# Patient Record
Sex: Female | Born: 1970 | Race: White | Hispanic: No | Marital: Married | Smoking: Never smoker
Health system: Southern US, Community
[De-identification: ages and names within clinical notes are randomized; demographics above are authoritative.]

## PROBLEM LIST (undated history)

## (undated) DIAGNOSIS — J302 Other seasonal allergic rhinitis: Secondary | ICD-10-CM

## (undated) HISTORY — PX: CHOLECYSTECTOMY: SHX55

---

## 2004-06-11 ENCOUNTER — Other Ambulatory Visit: Admission: RE | Admit: 2004-06-11 | Discharge: 2004-06-11 | Payer: Self-pay | Admitting: Obstetrics and Gynecology

## 2005-07-22 ENCOUNTER — Other Ambulatory Visit: Admission: RE | Admit: 2005-07-22 | Discharge: 2005-07-22 | Payer: Self-pay | Admitting: Obstetrics and Gynecology

## 2005-12-06 ENCOUNTER — Emergency Department (HOSPITAL_COMMUNITY): Admission: EM | Admit: 2005-12-06 | Discharge: 2005-12-06 | Payer: Self-pay | Admitting: Family Medicine

## 2006-02-25 ENCOUNTER — Encounter: Admission: RE | Admit: 2006-02-25 | Discharge: 2006-02-25 | Payer: Self-pay | Admitting: Obstetrics and Gynecology

## 2006-08-22 ENCOUNTER — Encounter: Admission: RE | Admit: 2006-08-22 | Discharge: 2006-08-22 | Payer: Self-pay | Admitting: Obstetrics and Gynecology

## 2007-02-26 ENCOUNTER — Encounter: Admission: RE | Admit: 2007-02-26 | Discharge: 2007-02-26 | Payer: Self-pay | Admitting: Obstetrics and Gynecology

## 2008-11-04 ENCOUNTER — Emergency Department (HOSPITAL_COMMUNITY): Admission: EM | Admit: 2008-11-04 | Discharge: 2008-11-04 | Payer: Self-pay | Admitting: Family Medicine

## 2010-02-15 ENCOUNTER — Ambulatory Visit: Payer: Self-pay | Admitting: Vascular Surgery

## 2010-05-26 ENCOUNTER — Encounter: Payer: Self-pay | Admitting: Obstetrics and Gynecology

## 2010-07-16 ENCOUNTER — Other Ambulatory Visit: Payer: Self-pay | Admitting: Obstetrics and Gynecology

## 2010-07-16 DIAGNOSIS — Z1231 Encounter for screening mammogram for malignant neoplasm of breast: Secondary | ICD-10-CM

## 2010-07-20 ENCOUNTER — Ambulatory Visit
Admission: RE | Admit: 2010-07-20 | Discharge: 2010-07-20 | Disposition: A | Discharge status: Home / Self Care | Payer: 59 | Source: Ambulatory Visit | Attending: Obstetrics and Gynecology | Admitting: Obstetrics and Gynecology

## 2010-07-20 DIAGNOSIS — Z1231 Encounter for screening mammogram for malignant neoplasm of breast: Secondary | ICD-10-CM

## 2011-06-23 ENCOUNTER — Encounter (HOSPITAL_COMMUNITY): Payer: Self-pay | Admitting: *Deleted

## 2011-06-23 ENCOUNTER — Emergency Department (HOSPITAL_COMMUNITY)
Admission: EM | Admit: 2011-06-23 | Discharge: 2011-06-23 | Disposition: A | Discharge status: Home / Self Care | Payer: 59 | Source: Home / Self Care

## 2011-06-23 DIAGNOSIS — N390 Urinary tract infection, site not specified: Secondary | ICD-10-CM

## 2011-06-23 HISTORY — DX: Other seasonal allergic rhinitis: J30.2

## 2011-06-23 LAB — POCT URINALYSIS DIP (DEVICE)
Glucose, UA: NEGATIVE mg/dL
Nitrite: NEGATIVE
Protein, ur: NEGATIVE mg/dL
Specific Gravity, Urine: 1.015 (ref 1.005–1.030)
Urobilinogen, UA: 0.2 mg/dL (ref 0.0–1.0)

## 2011-06-23 MED ORDER — CEPHALEXIN 500 MG PO CAPS: 500.0000 mg | ORAL_CAPSULE | Freq: Four times a day (QID) | ORAL | Status: AC

## 2011-06-23 NOTE — ED Provider Notes (Signed)
History     CSN: [id number]  Arrival date & time 06/23/11  1504   None     Chief Complaint  Patient presents with  . Urinary Frequency  . Dysuria    (Consider location/radiation/quality/duration/timing/severity/associated sxs/prior treatment) Patient is a 41 y.o. female presenting with dysuria. The history is provided by the patient.  Dysuria  This is a new problem. The current episode started 6 to 12 hours ago. The problem occurs every urination. The problem has been gradually worsening. The quality of the pain is described as burning. The pain is mild. There has been no fever. She is sexually active. There is no history of pyelonephritis. Associated symptoms include nausea, frequency, hematuria and urgency. Pertinent negatives include no chills, no vomiting, no discharge, no possible pregnancy and no flank pain. She has tried nothing for the symptoms.    Past Medical History  Diagnosis Date  . Seasonal allergies     Past Surgical History  Procedure Date  . Cholecystectomy     History reviewed. No pertinent family history.  History  Substance Use Topics  . Smoking status: Never Smoker   . Smokeless tobacco: Not on file  . Alcohol Use: No    OB History    Grav Para Term Preterm Abortions TAB SAB Ect Mult Living                  Review of Systems  Constitutional: Negative for fever and chills.  Gastrointestinal: Positive for nausea. Negative for vomiting and abdominal pain.  Genitourinary: Positive for dysuria, urgency, frequency and hematuria. Negative for flank pain.    Allergies  Erythromycin base  Home Medications   Current Outpatient Rx  Name Route Sig Dispense Refill  . FEXOFENADINE HCL 180 MG PO TABS Oral Take 180 mg by mouth daily.    Marland Kitchen FLUTICASONE PROPIONATE 50 MCG/ACT NA SUSP Nasal Place 2 sprays into the nose daily.    Marland Kitchen NORGESTIMATE-ETH ESTRADIOL 0.25-35 MG-MCG PO TABS Oral Take 1 tablet by mouth daily.    Marland Kitchen OVER THE COUNTER MEDICATION   Vitamins    . PHENYLEPHRINE HCL 10 MG PO TABS Oral Take 10 mg by mouth every 4 (four) hours as needed.    . CEPHALEXIN 500 MG PO CAPS Oral Take 1 capsule (500 mg total) by mouth 4 (four) times daily. 20 capsule 0    BP 120/77  Pulse 84  Temp 98.1 F (36.7 C)  Resp 16  SpO2 100%  LMP 05/31/2011  Physical Exam  Constitutional: She appears well-developed and well-nourished. No distress.  Cardiovascular: Normal rate and regular rhythm.   Pulmonary/Chest: Effort normal and breath sounds normal.  Abdominal: Soft. Bowel sounds are normal. There is no tenderness. There is no CVA tenderness.    ED Course  Procedures (including critical care time)  Labs Reviewed  POCT URINALYSIS DIP (DEVICE) - Abnormal; Notable for the following:    Hgb urine dipstick LARGE (*)    pH 8.5 (*)    Leukocytes, UA MODERATE (*) Biochemical Testing Only. Please order routine urinalysis from main lab if confirmatory testing is needed.   All other components within normal limits  POCT PREGNANCY, URINE   No results found.   1. UTI (lower urinary tract infection)       MDM          Cathlyn Parsons, NP 06/23/11 [phone]

## 2011-06-23 NOTE — Discharge Instructions (Signed)

## 2011-06-23 NOTE — ED Provider Notes (Signed)
Medical screening examination/treatment/procedure(s) were performed by non-physician practitioner and as supervising physician I was immediately available for consultation/collaboration.  Raynald Blend, MD 06/23/11 1800

## 2011-06-23 NOTE — ED Notes (Signed)
Pt with c/o burning with urination and frequency onset today - denies vaginal discharge or irritation

## 2011-07-03 ENCOUNTER — Other Ambulatory Visit: Payer: Self-pay | Admitting: Obstetrics and Gynecology

## 2011-07-03 DIAGNOSIS — Z1231 Encounter for screening mammogram for malignant neoplasm of breast: Secondary | ICD-10-CM

## 2011-08-22 ENCOUNTER — Ambulatory Visit
Admission: RE | Admit: 2011-08-22 | Discharge: 2011-08-22 | Disposition: A | Discharge status: Home / Self Care | Payer: 59 | Source: Ambulatory Visit | Attending: Obstetrics and Gynecology | Admitting: Obstetrics and Gynecology

## 2011-08-22 DIAGNOSIS — Z1231 Encounter for screening mammogram for malignant neoplasm of breast: Secondary | ICD-10-CM

## 2012-08-03 ENCOUNTER — Other Ambulatory Visit: Payer: Self-pay

## 2012-08-03 DIAGNOSIS — Z1231 Encounter for screening mammogram for malignant neoplasm of breast: Secondary | ICD-10-CM

## 2012-09-02 ENCOUNTER — Ambulatory Visit
Admission: RE | Admit: 2012-09-02 | Discharge: 2012-09-02 | Disposition: A | Discharge status: Home / Self Care | Payer: 59 | Source: Ambulatory Visit

## 2012-09-02 DIAGNOSIS — Z1231 Encounter for screening mammogram for malignant neoplasm of breast: Secondary | ICD-10-CM

## 2013-04-19 ENCOUNTER — Encounter (HOSPITAL_COMMUNITY): Payer: Self-pay | Admitting: Emergency Medicine

## 2013-04-19 ENCOUNTER — Emergency Department (HOSPITAL_COMMUNITY)
Admission: EM | Admit: 2013-04-19 | Discharge: 2013-04-19 | Disposition: A | Discharge status: Home / Self Care | Payer: 59 | Source: Home / Self Care | Attending: Family Medicine | Admitting: Family Medicine

## 2013-04-19 DIAGNOSIS — J329 Chronic sinusitis, unspecified: Secondary | ICD-10-CM

## 2013-04-19 MED ORDER — AMOXICILLIN 500 MG PO CAPS: 1000.0000 mg | ORAL_CAPSULE | Freq: Two times a day (BID) | ORAL | Status: DC

## 2013-04-19 MED ORDER — IPRATROPIUM BROMIDE 0.06 % NA SOLN: 2.0000 | Freq: Four times a day (QID) | NASAL | Status: DC

## 2013-04-19 NOTE — ED Provider Notes (Signed)
Kathy Boyd is a 42 y.o. female who presents to Urgent Care today for 3 days of right-sided sinus pain pressure. This is also associated with some sinus drainage. She has tried Advil Sudafed and Mucinex which are somewhat helpful. Patient has a history of frequent sinus infections. Her current symptoms are consistent with sinus infection. No nausea vomiting diarrhea fevers or chills.   Past Medical History  Diagnosis Date  . Seasonal allergies    History  Substance Use Topics  . Smoking status: Never Smoker   . Smokeless tobacco: Not on file  . Alcohol Use: No   ROS as above Medications reviewed. No current facility-administered medications for this encounter.   Current Outpatient Prescriptions  Medication Sig Dispense Refill  . amoxicillin (AMOXIL) 500 MG capsule Take 2 capsules (1,000 mg total) by mouth 2 (two) times daily.  28 capsule  0  . fexofenadine (ALLEGRA) 180 MG tablet Take 180 mg by mouth daily.      . fluticasone (FLONASE) 50 MCG/ACT nasal spray Place 2 sprays into the nose daily.      Marland Kitchen ipratropium (ATROVENT) 0.06 % nasal spray Place 2 sprays into both nostrils 4 (four) times daily.  15 mL  1  . norgestimate-ethinyl estradiol (ORTHO-CYCLEN,SPRINTEC,PREVIFEM) 0.25-35 MG-MCG tablet Take 1 tablet by mouth daily.      Marland Kitchen OVER THE COUNTER MEDICATION Vitamins      . phenylephrine (SUDAFED PE) 10 MG TABS Take 10 mg by mouth every 4 (four) hours as needed.        Exam:  BP 122/80  Pulse 76  Temp(Src) 98 F (36.7 C) (Oral)  Resp 16  SpO2 98% Gen: Well NAD HEENT: EOMI,  MMM, tender palpation right maxillary sinus. No discharge. Posterior pharynx mildly erythematous. Tympanic membranes are normal appearing bilaterally Lungs: Normal work of breathing. CTABL Heart: RRR no MRG Exts:  warm and well perfused.   No results found for this or any previous visit (from the past 24 hour(s)). No results found.  Assessment and Plan: 42 y.o. female with sinusitis. Plan for  management with Atrovent nasal spray and amoxicillin. F/u with ENT Discussed warning signs or symptoms. Please see discharge instructions. Patient expresses understanding.    Rodolph Bong, MD 04/19/13 [phone]

## 2013-04-19 NOTE — ED Notes (Signed)
Reported recurrent sinus infection

## 2013-08-05 ENCOUNTER — Other Ambulatory Visit: Payer: Self-pay

## 2013-08-05 DIAGNOSIS — Z1231 Encounter for screening mammogram for malignant neoplasm of breast: Secondary | ICD-10-CM

## 2013-09-07 ENCOUNTER — Ambulatory Visit
Admission: RE | Admit: 2013-09-07 | Discharge: 2013-09-07 | Disposition: A | Discharge status: Home / Self Care | Payer: 59 | Source: Ambulatory Visit

## 2013-09-07 ENCOUNTER — Encounter (INDEPENDENT_AMBULATORY_CARE_PROVIDER_SITE_OTHER): Payer: Self-pay

## 2013-09-07 DIAGNOSIS — Z1231 Encounter for screening mammogram for malignant neoplasm of breast: Secondary | ICD-10-CM

## 2014-05-02 ENCOUNTER — Telehealth: Payer: Self-pay | Admitting: Family

## 2014-05-02 DIAGNOSIS — J019 Acute sinusitis, unspecified: Secondary | ICD-10-CM

## 2014-05-02 MED ORDER — AMOXICILLIN-POT CLAVULANATE 875-125 MG PO TABS: 1.0000 | ORAL_TABLET | Freq: Two times a day (BID) | ORAL | Status: DC

## 2014-05-02 NOTE — Progress Notes (Signed)

## 2014-08-01 ENCOUNTER — Other Ambulatory Visit: Payer: Self-pay

## 2014-08-01 DIAGNOSIS — Z1231 Encounter for screening mammogram for malignant neoplasm of breast: Secondary | ICD-10-CM

## 2014-09-09 ENCOUNTER — Ambulatory Visit
Admission: RE | Admit: 2014-09-09 | Discharge: 2014-09-09 | Disposition: A | Discharge status: Home / Self Care | Payer: 59 | Source: Ambulatory Visit

## 2014-09-09 DIAGNOSIS — Z1231 Encounter for screening mammogram for malignant neoplasm of breast: Secondary | ICD-10-CM

## 2014-09-12 ENCOUNTER — Telehealth: Payer: Self-pay | Admitting: Family

## 2014-09-12 DIAGNOSIS — J019 Acute sinusitis, unspecified: Secondary | ICD-10-CM

## 2014-09-12 MED ORDER — AMOXICILLIN-POT CLAVULANATE 875-125 MG PO TABS: 1.0000 | ORAL_TABLET | Freq: Two times a day (BID) | ORAL | Status: DC

## 2014-09-12 NOTE — Progress Notes (Signed)

## 2015-08-08 ENCOUNTER — Telehealth: Payer: 59 | Admitting: Family

## 2015-08-08 DIAGNOSIS — J019 Acute sinusitis, unspecified: Secondary | ICD-10-CM

## 2015-08-08 DIAGNOSIS — B9689 Other specified bacterial agents as the cause of diseases classified elsewhere: Secondary | ICD-10-CM

## 2015-08-08 MED ORDER — AMOXICILLIN-POT CLAVULANATE 875-125 MG PO TABS: 1.0000 | ORAL_TABLET | Freq: Two times a day (BID) | ORAL | Status: DC

## 2015-08-08 NOTE — Progress Notes (Signed)

## 2015-08-11 ENCOUNTER — Other Ambulatory Visit: Payer: Self-pay

## 2015-08-11 DIAGNOSIS — Z1231 Encounter for screening mammogram for malignant neoplasm of breast: Secondary | ICD-10-CM

## 2015-09-11 ENCOUNTER — Ambulatory Visit
Admission: RE | Admit: 2015-09-11 | Discharge: 2015-09-11 | Disposition: A | Discharge status: Home / Self Care | Payer: 59 | Source: Ambulatory Visit

## 2015-09-11 DIAGNOSIS — Z1231 Encounter for screening mammogram for malignant neoplasm of breast: Secondary | ICD-10-CM | POA: Diagnosis not present

## 2015-10-20 ENCOUNTER — Telehealth: Payer: 59 | Admitting: Nurse Practitioner

## 2015-10-20 DIAGNOSIS — J0101 Acute recurrent maxillary sinusitis: Secondary | ICD-10-CM

## 2015-10-20 MED ORDER — AMOXICILLIN 500 MG PO CAPS: 1000.0000 mg | ORAL_CAPSULE | Freq: Two times a day (BID) | ORAL | Status: DC

## 2015-10-20 NOTE — Progress Notes (Signed)
We are sorry that you are not feeling well.  Here is how we plan to help!  Based on what you have shared with me it looks like you have sinusitis.  Sinusitis is inflammation and infection in the sinus cavities of the head.  Based on your presentation I believe you most likely have Acute Bacterial Sinusitis.  This is an infection caused by bacteria and is treated with antibiotics. I have prescribed amoxicillin 500 mg 2po bid. You may use an oral decongestant such as Mucinex D or if you have glaucoma or high blood pressure use plain Mucinex. Saline nasal spray help and can safely be used as often as needed for congestion.  If you develop worsening sinus pain, fever or notice severe headache and vision changes, or if symptoms are not better after completion of antibiotic, please schedule an appointment with a health care provider.    Sinus infections are not as easily transmitted as other respiratory infection, however we still recommend that you avoid close contact with loved ones, especially the very young and elderly.  Remember to wash your hands thoroughly throughout the day as this is the number one way to prevent the spread of infection!  Home Care:  Only take medications as instructed by your medical team.  Complete the entire course of an antibiotic.  Do not take these medications with alcohol.  A steam or ultrasonic humidifier can help congestion.  You can place a towel over your head and breathe in the steam from hot water coming from a faucet.  Avoid close contacts especially the very young and the elderly.  Cover your mouth when you cough or sneeze.  Always remember to wash your hands.  Get Help Right Away If:  You develop worsening fever or sinus pain.  You develop a severe head ache or visual changes.  Your symptoms persist after you have completed your treatment plan.  Make sure you  Understand these instructions.  Will watch your condition.  Will get help right away  if you are not doing well or get worse.  Your e-visit answers were reviewed by a board certified advanced clinical practitioner to complete your personal care plan.  Depending on the condition, your plan could have included both over the counter or prescription medications.  If there is a problem please reply  once you have received a response from your provider.  Your safety is important to us.  If you have drug allergies check your prescription carefully.    You can use MyChart to ask questions about today's visit, request a non-urgent call back, or ask for a work or school excuse for 24 hours related to this e-Visit. If it has been greater than 24 hours you will need to follow up with your provider, or enter a new e-Visit to address those concerns.  You will get an e-mail in the next two days asking about your experience.  I hope that your e-visit has been valuable and will speed your recovery. Thank you for using e-visits.

## 2016-02-07 DIAGNOSIS — Z01419 Encounter for gynecological examination (general) (routine) without abnormal findings: Secondary | ICD-10-CM | POA: Diagnosis not present

## 2016-02-07 DIAGNOSIS — Z6838 Body mass index (BMI) 38.0-38.9, adult: Secondary | ICD-10-CM | POA: Diagnosis not present

## 2016-02-11 ENCOUNTER — Telehealth: Payer: 59 | Admitting: Family

## 2016-02-11 DIAGNOSIS — J329 Chronic sinusitis, unspecified: Secondary | ICD-10-CM | POA: Diagnosis not present

## 2016-02-11 DIAGNOSIS — B9689 Other specified bacterial agents as the cause of diseases classified elsewhere: Secondary | ICD-10-CM | POA: Diagnosis not present

## 2016-02-11 MED ORDER — AMOXICILLIN-POT CLAVULANATE 875-125 MG PO TABS: 1.0000 | ORAL_TABLET | Freq: Two times a day (BID) | ORAL | 0 refills | Status: AC

## 2016-02-11 NOTE — Progress Notes (Signed)

## 2016-02-12 MED FILL — AMOX-CLAV 875-125 MG TABLET: 875-125 | 7 days supply | Qty: 14 | Fill #0

## 2016-03-12 ENCOUNTER — Telehealth: Payer: 59 | Admitting: Family

## 2016-03-12 DIAGNOSIS — J320 Chronic maxillary sinusitis: Secondary | ICD-10-CM

## 2016-03-12 NOTE — Progress Notes (Signed)
Based on what you shared with me it looks like you have a serious condition that should be evaluated in a face to face office visit.  I am concerned that you have had 3 similar episodes this year. This warrants further evaluation.   If you are having a true medical emergency please call 911.  If you need an urgent face to face visit, [place] has four urgent care centers for your convenience.  If you need care fast and have a high deductible or no insurance consider:   [email or web address]  [phone]  [address], [address] [place], [place] [address] 8 am to 8 pm Monday-Friday 10 am to 4 pm Saturday-Sunday   The following sites will take your  insurance:    . [facility]  [phone] Get Driving Directions Find a Provider at this Location  [address] [place], [place] [address] . 10 am to 8 pm Monday-Friday . 12 pm to 8 pm Saturday-Sunday   . [facility]  [phone] Get Driving Directions Find a Provider at this Location  [address] [place], [place] [address] . 8 am to 8 pm Monday-Friday . 9 am to 6 pm Saturday . 11 am to 6 pm Sunday   . [facility] [place]  [phone] Get Driving Directions  [address] [place], [place] [address] . 8 am to 8 pm Monday-Friday . 8 am to 4 pm Saturday-Sunday   . [facility] (walk-ins welcome, or call for a scheduled time)  [phone]  Get Driving Directions Find a Provider at this Location  [address] [place], [place] [address] . 8 am to 8:30 pm Monday-Thursday . 8 am to 6 pm Friday . 8 am to 4 pm Saturday-Sunday   Your e-visit answers were reviewed by a board certified advanced clinical practitioner to complete your personal care plan.  Thank you for using e-Visits.

## 2016-04-30 ENCOUNTER — Telehealth: Payer: 59 | Admitting: Family

## 2016-04-30 DIAGNOSIS — A499 Bacterial infection, unspecified: Secondary | ICD-10-CM | POA: Diagnosis not present

## 2016-04-30 DIAGNOSIS — N390 Urinary tract infection, site not specified: Secondary | ICD-10-CM | POA: Diagnosis not present

## 2016-04-30 MED ORDER — NITROFURANTOIN MONOHYD MACRO 100 MG PO CAPS: 100.0000 mg | ORAL_CAPSULE | Freq: Two times a day (BID) | ORAL | 0 refills | Status: DC

## 2016-04-30 NOTE — Progress Notes (Signed)

## 2016-05-24 ENCOUNTER — Telehealth: Payer: 59 | Admitting: Nurse Practitioner

## 2016-05-24 DIAGNOSIS — J0101 Acute recurrent maxillary sinusitis: Secondary | ICD-10-CM

## 2016-05-24 MED ORDER — AMOXICILLIN-POT CLAVULANATE 875-125 MG PO TABS: 1.0000 | ORAL_TABLET | Freq: Two times a day (BID) | ORAL | 0 refills | Status: DC

## 2016-05-24 NOTE — Progress Notes (Signed)

## 2016-07-02 ENCOUNTER — Telehealth: Payer: 59 | Admitting: Family

## 2016-07-02 DIAGNOSIS — J310 Chronic rhinitis: Secondary | ICD-10-CM | POA: Diagnosis not present

## 2016-07-02 DIAGNOSIS — H6501 Acute serous otitis media, right ear: Secondary | ICD-10-CM | POA: Diagnosis not present

## 2016-07-02 DIAGNOSIS — H6983 Other specified disorders of Eustachian tube, bilateral: Secondary | ICD-10-CM | POA: Diagnosis not present

## 2016-07-02 DIAGNOSIS — J0101 Acute recurrent maxillary sinusitis: Secondary | ICD-10-CM

## 2016-07-02 NOTE — Progress Notes (Signed)
Based on what you shared with me it looks like you have a serious condition that should be evaluated in a face to face office visit.  NOTE: Even if you have entered your credit card information for this eVisit, you will not be charged.   If you are having a true medical emergency please call 911.  If you need an urgent face to face visit, [facility] has four urgent care centers for your convenience.  If you need care fast and have a high deductible or no insurance consider:   [email or web address]  [phone]  [address], [address] [place], [place] [address] 8 am to 8 pm Monday-Friday 10 am to 4 pm Saturday-Sunday   The following sites will take your  insurance:    . [facility]  [phone] Get Driving Directions Find a Provider at this Location  [address] , [place] [address] . 10 am to 8 pm Monday-Friday . 12 pm to 8 pm Saturday-Sunday   . [facility]  [phone] Get Driving Directions Find a Provider at this Location  [address] [place], [place] [address] . 8 am to 8 pm Monday-Friday . 9 am to 6 pm Saturday . 11 am to 6 pm Sunday   . [facility] [place]  [phone] Get Driving Directions  [address] [place], [place] [address] . 8 am to 8 pm Monday-Friday . 8 am to 4 pm Saturday-Sunday   Your e-visit answers were reviewed by a board certified advanced clinical practitioner to complete your personal care plan.  Thank you for using e-Visits.  

## 2016-07-03 DIAGNOSIS — Z6837 Body mass index (BMI) 37.0-37.9, adult: Secondary | ICD-10-CM | POA: Diagnosis not present

## 2016-07-03 DIAGNOSIS — J0101 Acute recurrent maxillary sinusitis: Secondary | ICD-10-CM | POA: Diagnosis not present

## 2016-07-29 ENCOUNTER — Other Ambulatory Visit: Payer: Self-pay | Admitting: Obstetrics and Gynecology

## 2016-07-29 DIAGNOSIS — Z1231 Encounter for screening mammogram for malignant neoplasm of breast: Secondary | ICD-10-CM

## 2016-08-25 ENCOUNTER — Telehealth: Payer: 59 | Admitting: Family

## 2016-08-25 DIAGNOSIS — J029 Acute pharyngitis, unspecified: Secondary | ICD-10-CM | POA: Diagnosis not present

## 2016-08-25 MED ORDER — BENZONATATE 100 MG PO CAPS: 100.0000 mg | ORAL_CAPSULE | Freq: Three times a day (TID) | ORAL | 0 refills | Status: DC | PRN

## 2016-08-25 NOTE — Progress Notes (Signed)
We are sorry that you are not feeling well.  Here is how we plan to help!  Based on what you have shared with me it looks like you have upper respiratory tract inflammation that has resulted in a significant cough.  Inflammation and infection in the upper respiratory tract is commonly called bronchitis and has four common causes:  Allergies, Viral Infections, Acid Reflux and Bacterial Infections.  Allergies, viruses and acid reflux are treated by controlling symptoms or eliminating the cause. An example might be a cough caused by taking certain blood pressure medications. You stop the cough by changing the medication. Another example might be a cough caused by acid reflux. Controlling the reflux helps control the cough.  Based on your presentation I believe you most likely have A cough due to a virus.  This is called viral bronchitis and is best treated by rest, plenty of fluids and control of the cough.  You may use Ibuprofen or Tylenol as directed to help your symptoms.     In addition you may use A non-prescription cough medication called Mucinex DM: take 2 tablets every 12 hours. and A prescription cough medication called Tessalon Perles 100mg. You may take 1-2 capsules every 8 hours as needed for your cough.   USE OF BRONCHODILATOR ("RESCUE") INHALERS: There is a risk from using your bronchodilator too frequently.  The risk is that over-reliance on a medication which only relaxes the muscles surrounding the breathing tubes can reduce the effectiveness of medications prescribed to reduce swelling and congestion of the tubes themselves.  Although you feel brief relief from the bronchodilator inhaler, your asthma may actually be worsening with the tubes becoming more swollen and filled with mucus.  This can delay other crucial treatments, such as oral steroid medications. If you need to use a bronchodilator inhaler daily, several times per day, you should discuss this with your provider.  There are  probably better treatments that could be used to keep your asthma under control.     HOME CARE . Only take medications as instructed by your medical team. . Complete the entire course of an antibiotic. . Drink plenty of fluids and get plenty of rest. . Avoid close contacts especially the very young and the elderly . Cover your mouth if you cough or cough into your sleeve. . Always remember to wash your hands . A steam or ultrasonic humidifier can help congestion.   GET HELP RIGHT AWAY IF: . You develop worsening fever. . You become short of breath . You cough up blood. . Your symptoms persist after you have completed your treatment plan MAKE SURE YOU   Understand these instructions.  Will watch your condition.  Will get help right away if you are not doing well or get worse.  Your e-visit answers were reviewed by a board certified advanced clinical practitioner to complete your personal care plan.  Depending on the condition, your plan could have included both over the counter or prescription medications. If there is a problem please reply  once you have received a response from your provider. Your safety is important to us.  If you have drug allergies check your prescription carefully.    You can use MyChart to ask questions about today's visit, request a non-urgent call back, or ask for a work or school excuse for 24 hours related to this e-Visit. If it has been greater than 24 hours you will need to follow up with your provider, or enter a new   e-Visit to address those concerns. You will get an e-mail in the next two days asking about your experience.  I hope that your e-visit has been valuable and will speed your recovery. Thank you for using e-visits.   

## 2016-08-31 ENCOUNTER — Telehealth: Payer: 59 | Admitting: Physician Assistant

## 2016-08-31 DIAGNOSIS — J329 Chronic sinusitis, unspecified: Secondary | ICD-10-CM | POA: Diagnosis not present

## 2016-08-31 DIAGNOSIS — B9789 Other viral agents as the cause of diseases classified elsewhere: Secondary | ICD-10-CM

## 2016-08-31 MED ORDER — AZELASTINE HCL 0.1 % NA SOLN: 2.0000 | Freq: Two times a day (BID) | NASAL | 12 refills | Status: DC

## 2016-08-31 NOTE — Progress Notes (Signed)

## 2016-09-01 DIAGNOSIS — J019 Acute sinusitis, unspecified: Secondary | ICD-10-CM | POA: Diagnosis not present

## 2016-09-11 ENCOUNTER — Ambulatory Visit
Admission: RE | Admit: 2016-09-11 | Discharge: 2016-09-11 | Disposition: A | Discharge status: Home / Self Care | Payer: 59 | Source: Ambulatory Visit | Attending: Obstetrics and Gynecology | Admitting: Obstetrics and Gynecology

## 2016-09-11 DIAGNOSIS — J320 Chronic maxillary sinusitis: Secondary | ICD-10-CM | POA: Diagnosis not present

## 2016-09-11 DIAGNOSIS — H6983 Other specified disorders of Eustachian tube, bilateral: Secondary | ICD-10-CM | POA: Diagnosis not present

## 2016-09-11 DIAGNOSIS — Z1231 Encounter for screening mammogram for malignant neoplasm of breast: Secondary | ICD-10-CM | POA: Diagnosis not present

## 2016-09-11 DIAGNOSIS — J322 Chronic ethmoidal sinusitis: Secondary | ICD-10-CM | POA: Diagnosis not present

## 2016-09-11 DIAGNOSIS — J310 Chronic rhinitis: Secondary | ICD-10-CM | POA: Diagnosis not present

## 2016-09-12 MED FILL — FLUTICASONE PROP 50 MCG SPR: 50 | 30 days supply | Qty: 16 | Fill #0

## 2016-09-26 ENCOUNTER — Ambulatory Visit (INDEPENDENT_AMBULATORY_CARE_PROVIDER_SITE_OTHER): Payer: 59 | Admitting: Otolaryngology

## 2016-09-26 DIAGNOSIS — J343 Hypertrophy of nasal turbinates: Secondary | ICD-10-CM

## 2016-09-26 DIAGNOSIS — J310 Chronic rhinitis: Secondary | ICD-10-CM | POA: Diagnosis not present

## 2016-10-14 MED FILL — FLUTICASONE PROP 50 MCG SPR: 50 | 30 days supply | Qty: 16 | Fill #1

## 2016-11-24 MED FILL — FLUTICASONE PROP 50 MCG SPR: 50 | 30 days supply | Qty: 16 | Fill #2

## 2016-12-20 ENCOUNTER — Telehealth: Payer: 59 | Admitting: Family

## 2016-12-20 DIAGNOSIS — J019 Acute sinusitis, unspecified: Secondary | ICD-10-CM | POA: Diagnosis not present

## 2016-12-20 MED ORDER — FLUTICASONE PROPIONATE 50 MCG/ACT NA SUSP: 2.0000 | NASAL | 6 refills | Status: DC

## 2016-12-20 MED FILL — FLUTICASONE PROP 50 MCG SPR: 50 | 30 days supply | Qty: 16 | Fill #0

## 2016-12-20 NOTE — Progress Notes (Signed)
Thank you for the details you put in the comment boxes. Those details really help [place] take better care of you. This does not indicate antibiotics as nearly 90% of these infections have been found to be viruses.   We are sorry that you are not feeling well.  Here is how we plan to help!  Based on what you have shared with me it looks like you have sinusitis.  Sinusitis is inflammation and infection in the sinus cavities of the head.  Based on your presentation I believe you most likely have Acute Viral Sinusitis.This is an infection most likely caused by a virus. There is not specific treatment for viral sinusitis other than to help you with the symptoms until the infection runs its course.  You may use an oral decongestant such as Mucinex D or if you have glaucoma or high blood pressure use plain Mucinex. Saline nasal spray help and can safely be used as often as needed for congestion, I have prescribed: Fluticasone nasal spray two sprays in each nostril twice a day for 7 days then once daily after that if needed.   Some authorities believe that zinc sprays or the use of Echinacea may shorten the course of your symptoms.  Sinus infections are not as easily transmitted as other respiratory infection, however we still recommend that you avoid close contact with loved ones, especially the very young and elderly.  Remember to wash your hands thoroughly throughout the day as this is the number one way to prevent the spread of infection!  Home Care:  Only take medications as instructed by your medical team.  Complete the entire course of an antibiotic.  Do not take these medications with alcohol.  A steam or ultrasonic humidifier can help congestion.  You can place a towel over your head and breathe in the steam from hot water coming from a faucet.  Avoid close contacts especially the very young and the elderly.  Cover your mouth when you cough or sneeze.  Always remember to wash your hands.  Get  Help Right Away If:  You develop worsening fever or sinus pain.  You develop a severe head ache or visual changes.  Your symptoms persist after you have completed your treatment plan.  Make sure you  Understand these instructions.  Will watch your condition.  Will get help right away if you are not doing well or get worse.  Your e-visit answers were reviewed by a board certified advanced clinical practitioner to complete your personal care plan.  Depending on the condition, your plan could have included both over the counter or prescription medications.  If there is a problem please reply  once you have received a response from your provider.  Your safety is important to [place].  If you have drug allergies check your prescription carefully.    You can use MyChart to ask questions about today's visit, request a non-urgent call back, or ask for a work or school excuse for 24 hours related to this e-Visit. If it has been greater than 24 hours you will need to follow up with your provider, or enter a new e-Visit to address those concerns.  You will get an e-mail in the next two days asking about your experience.  I hope that your e-visit has been valuable and will speed your recovery. Thank you for using e-visits.

## 2016-12-30 MED FILL — TRIAMTERENE-HCTZ 75-50 MG T: 75-50 | 90 days supply | Qty: 90 | Fill #0

## 2017-01-14 ENCOUNTER — Ambulatory Visit (INDEPENDENT_AMBULATORY_CARE_PROVIDER_SITE_OTHER): Payer: Self-pay | Admitting: Nurse Practitioner

## 2017-01-14 VITALS — BP 140/96 | HR 72 | Temp 98.2°F | Resp 16

## 2017-01-14 DIAGNOSIS — J0101 Acute recurrent maxillary sinusitis: Secondary | ICD-10-CM

## 2017-01-14 MED ORDER — CEFDINIR 300 MG PO CAPS: 300.0000 mg | ORAL_CAPSULE | Freq: Two times a day (BID) | ORAL | 0 refills | Status: AC

## 2017-01-14 MED ORDER — PREDNISONE 10 MG (21) PO TBPK
ORAL_TABLET | ORAL | 0 refills | Status: DC
Start: 2017-01-20 — End: 2017-04-23

## 2017-01-14 MED FILL — CEFDINIR 300 MG CAPSULE: 300 | 10 days supply | Qty: 20 | Fill #0

## 2017-01-14 MED FILL — predniSONE 10 MG (21) TBPK: 10 | 6 days supply | Qty: 21 | Fill #0

## 2017-01-14 NOTE — Patient Instructions (Addendum)

## 2017-01-14 NOTE — Progress Notes (Addendum)
Subjective:  Kathy Boyd is a 46 y.o. female who presents for evaluation of possible sinusitis.  Symptoms include facial pain, nasal congestion, post nasal drip, sinus pressure, sinus pain and scratchy throat.  Onset of symptoms was 4 days ago, and has been gradually worsening since that time.  Treatment to date:  Sudafed, Flonase and Aleve.  High risk factors for influenza complications:  history of recurrent sinusitis..  The following portions of the patient's history were reviewed and updated as appropriate:  allergies, current medications and past medical history.  Constitutional: positive for fatigue, negative for fevers Eyes: negative Ears, nose, mouth, throat, and face: positive for nasal congestion and scratchy throat, negative for earaches and hoarseness Respiratory: negative Cardiovascular: negative Neurological: positive for headaches and rates pain 9/10 Allergic/Immunologic: positive for hay fever Objective:  BP (!) 140/96 (BP Location: Right Arm, Patient Position: Sitting, Cuff Size: Normal)   Pulse 72   Temp 98.2 F (36.8 C) (Oral)   Resp 16   SpO2 98%  General appearance: alert, cooperative and mild distress Head: Normocephalic, without obvious abnormality, atraumatic Eyes: conjunctivae/corneas clear. PERRL, EOM's intact. Fundi benign. Ears: normal TM's and external ear canals both ears Nose: turbinates red, swollen, increased on the left maxillary sinus tenderness left, no frontal sinus tenderness bilateral Throat: lips, mucosa, and tongue normal; teeth and gums normal and +PND Lungs: clear to auscultation bilaterally Skin: Skin color, texture, turgor normal. No rashes or lesions    Assessment:  sinusitis    Plan:  Discussed diagnosis and treatment of sinusitis. Educational material distributed and questions answered. Nasal steroids per orders. Follow up as needed.  Continue use of Flonase daily.  Follow up with ENT as instructed.

## 2017-01-16 ENCOUNTER — Telehealth: Payer: Self-pay | Admitting: Nurse Practitioner

## 2017-01-16 ENCOUNTER — Telehealth: Payer: Self-pay

## 2017-01-16 NOTE — Telephone Encounter (Signed)
Called patient to follow up on her status.  Reached voicemail, left message for patient asking her to return my call.

## 2017-01-19 MED FILL — FLUTICASONE PROP 50 MCG SPR: 50 | 30 days supply | Qty: 16 | Fill #3

## 2017-01-21 ENCOUNTER — Other Ambulatory Visit (INDEPENDENT_AMBULATORY_CARE_PROVIDER_SITE_OTHER): Payer: Self-pay | Admitting: Otolaryngology

## 2017-01-21 DIAGNOSIS — J322 Chronic ethmoidal sinusitis: Secondary | ICD-10-CM | POA: Diagnosis not present

## 2017-01-21 DIAGNOSIS — J310 Chronic rhinitis: Secondary | ICD-10-CM | POA: Diagnosis not present

## 2017-01-21 DIAGNOSIS — J320 Chronic maxillary sinusitis: Secondary | ICD-10-CM

## 2017-01-24 ENCOUNTER — Ambulatory Visit (HOSPITAL_COMMUNITY)
Admission: RE | Admit: 2017-01-24 | Discharge: 2017-01-24 | Disposition: A | Discharge status: Home / Self Care | Payer: 59 | Source: Ambulatory Visit | Attending: Otolaryngology | Admitting: Otolaryngology

## 2017-01-24 DIAGNOSIS — J320 Chronic maxillary sinusitis: Secondary | ICD-10-CM | POA: Insufficient documentation

## 2017-01-24 DIAGNOSIS — J324 Chronic pansinusitis: Secondary | ICD-10-CM | POA: Diagnosis not present

## 2017-02-04 DIAGNOSIS — J322 Chronic ethmoidal sinusitis: Secondary | ICD-10-CM | POA: Diagnosis not present

## 2017-02-04 DIAGNOSIS — J310 Chronic rhinitis: Secondary | ICD-10-CM | POA: Diagnosis not present

## 2017-02-04 DIAGNOSIS — J320 Chronic maxillary sinusitis: Secondary | ICD-10-CM | POA: Diagnosis not present

## 2017-02-13 DIAGNOSIS — Z01419 Encounter for gynecological examination (general) (routine) without abnormal findings: Secondary | ICD-10-CM | POA: Diagnosis not present

## 2017-02-13 DIAGNOSIS — Z6839 Body mass index (BMI) 39.0-39.9, adult: Secondary | ICD-10-CM | POA: Diagnosis not present

## 2017-02-17 MED FILL — FLUTICASONE PROP 50 MCG SPR: 50 | 30 days supply | Qty: 16 | Fill #4

## 2017-02-18 MED FILL — predniSONE 10 MG (21) TBPK: 10 | 6 days supply | Qty: 21 | Fill #0

## 2017-02-18 MED FILL — levoFLOXacin 500 MG TABS: 500 | 7 days supply | Qty: 7 | Fill #0

## 2017-03-17 MED FILL — levoFLOXacin 500 MG TABS: 500 | 7 days supply | Qty: 7 | Fill #1

## 2017-03-17 MED FILL — FLUTICASONE PROP 50 MCG SPR: 50 | 30 days supply | Qty: 16 | Fill #5

## 2017-03-17 MED FILL — predniSONE 10 MG (21) TBPK: 10 | 6 days supply | Qty: 21 | Fill #1

## 2017-03-18 MED FILL — NORG-ETHIN ESTRA 0.25-0.035: 0.25-35 | 84 days supply | Qty: 84 | Fill #0

## 2017-04-07 MED FILL — predniSONE 10 MG (21) TBPK: 10 | 6 days supply | Qty: 21 | Fill #2

## 2017-04-07 MED FILL — levoFLOXacin 500 MG TABS: 500 | 7 days supply | Qty: 7 | Fill #2

## 2017-04-15 DIAGNOSIS — J320 Chronic maxillary sinusitis: Secondary | ICD-10-CM | POA: Diagnosis not present

## 2017-04-15 DIAGNOSIS — J321 Chronic frontal sinusitis: Secondary | ICD-10-CM | POA: Diagnosis not present

## 2017-04-15 DIAGNOSIS — J322 Chronic ethmoidal sinusitis: Secondary | ICD-10-CM | POA: Diagnosis not present

## 2017-04-16 ENCOUNTER — Other Ambulatory Visit (INDEPENDENT_AMBULATORY_CARE_PROVIDER_SITE_OTHER): Payer: Self-pay | Admitting: Otolaryngology

## 2017-04-16 DIAGNOSIS — J329 Chronic sinusitis, unspecified: Secondary | ICD-10-CM

## 2017-04-21 ENCOUNTER — Ambulatory Visit (HOSPITAL_COMMUNITY)
Admission: RE | Admit: 2017-04-21 | Discharge: 2017-04-21 | Disposition: A | Discharge status: Home / Self Care | Payer: 59 | Source: Ambulatory Visit | Attending: Otolaryngology | Admitting: Otolaryngology

## 2017-04-21 DIAGNOSIS — J329 Chronic sinusitis, unspecified: Secondary | ICD-10-CM | POA: Diagnosis not present

## 2017-04-22 ENCOUNTER — Other Ambulatory Visit: Payer: Self-pay | Admitting: Otolaryngology

## 2017-04-23 ENCOUNTER — Encounter (HOSPITAL_BASED_OUTPATIENT_CLINIC_OR_DEPARTMENT_OTHER): Payer: Self-pay | Admitting: *Deleted

## 2017-04-30 ENCOUNTER — Other Ambulatory Visit: Payer: Self-pay

## 2017-04-30 ENCOUNTER — Encounter (HOSPITAL_BASED_OUTPATIENT_CLINIC_OR_DEPARTMENT_OTHER)
Admission: RE | Disposition: A | Discharge status: Home / Self Care | Payer: Self-pay | Source: Ambulatory Visit | Attending: Otolaryngology

## 2017-04-30 ENCOUNTER — Ambulatory Visit (HOSPITAL_BASED_OUTPATIENT_CLINIC_OR_DEPARTMENT_OTHER): Payer: 59 | Admitting: Anesthesiology

## 2017-04-30 ENCOUNTER — Encounter (HOSPITAL_BASED_OUTPATIENT_CLINIC_OR_DEPARTMENT_OTHER): Payer: Self-pay | Admitting: Emergency Medicine

## 2017-04-30 ENCOUNTER — Ambulatory Visit (HOSPITAL_BASED_OUTPATIENT_CLINIC_OR_DEPARTMENT_OTHER)
Admission: RE | Admit: 2017-04-30 | Discharge: 2017-04-30 | Disposition: A | Discharge status: Home / Self Care | Payer: 59 | Source: Ambulatory Visit | Attending: Otolaryngology | Admitting: Otolaryngology

## 2017-04-30 DIAGNOSIS — J320 Chronic maxillary sinusitis: Secondary | ICD-10-CM | POA: Insufficient documentation

## 2017-04-30 DIAGNOSIS — J321 Chronic frontal sinusitis: Secondary | ICD-10-CM | POA: Insufficient documentation

## 2017-04-30 DIAGNOSIS — J322 Chronic ethmoidal sinusitis: Secondary | ICD-10-CM | POA: Insufficient documentation

## 2017-04-30 DIAGNOSIS — Z79899 Other long term (current) drug therapy: Secondary | ICD-10-CM | POA: Insufficient documentation

## 2017-04-30 DIAGNOSIS — Z6835 Body mass index (BMI) 35.0-35.9, adult: Secondary | ICD-10-CM | POA: Insufficient documentation

## 2017-04-30 DIAGNOSIS — J329 Chronic sinusitis, unspecified: Secondary | ICD-10-CM | POA: Diagnosis not present

## 2017-04-30 DIAGNOSIS — E669 Obesity, unspecified: Secondary | ICD-10-CM | POA: Diagnosis not present

## 2017-04-30 HISTORY — PX: SINUS ENDO WITH FUSION: SHX5329

## 2017-04-30 HISTORY — PX: ETHMOIDECTOMY: SHX5197

## 2017-04-30 HISTORY — PX: MAXILLARY ANTROSTOMY: SHX2003

## 2017-04-30 SURGERY — MAXILLARY ANTROSTOMY
Anesthesia: General | Site: Nose | Laterality: Bilateral

## 2017-04-30 MED ORDER — GLYCOPYRROLATE 0.2 MG/ML IJ SOLN
INTRAMUSCULAR | Status: DC | PRN
  Administered 2017-04-30: 0.6 mg via INTRAVENOUS

## 2017-04-30 MED ORDER — LACTATED RINGERS IV SOLN
INTRAVENOUS | Status: DC
  Administered 2017-04-30 (×2): via INTRAVENOUS

## 2017-04-30 MED ORDER — PROPOFOL 10 MG/ML IV BOLUS
INTRAVENOUS | Status: AC
  Filled 2017-04-30: qty 20

## 2017-04-30 MED ORDER — MUPIROCIN 2 % EX OINT
TOPICAL_OINTMENT | CUTANEOUS | Status: AC
Start: 2017-04-30 — End: 2017-04-30
  Filled 2017-04-30: qty 22

## 2017-04-30 MED ORDER — MIDAZOLAM HCL 2 MG/2ML IJ SOLN
INTRAMUSCULAR | Status: AC
  Filled 2017-04-30: qty 2

## 2017-04-30 MED ORDER — ROCURONIUM BROMIDE 100 MG/10ML IV SOLN
INTRAVENOUS | Status: DC | PRN
  Administered 2017-04-30: 40 mg via INTRAVENOUS

## 2017-04-30 MED ORDER — ONDANSETRON HCL 4 MG/2ML IJ SOLN
INTRAMUSCULAR | Status: DC | PRN
  Administered 2017-04-30: 4 mg via INTRAVENOUS

## 2017-04-30 MED ORDER — PROPOFOL 10 MG/ML IV BOLUS
INTRAVENOUS | Status: DC | PRN
  Administered 2017-04-30: 150 mg via INTRAVENOUS

## 2017-04-30 MED ORDER — ACETAMINOPHEN 500 MG PO TABS
ORAL_TABLET | ORAL | Status: AC
  Filled 2017-04-30: qty 2

## 2017-04-30 MED ORDER — SCOPOLAMINE 1 MG/3DAYS TD PT72
MEDICATED_PATCH | TRANSDERMAL | Status: AC
  Filled 2017-04-30: qty 1

## 2017-04-30 MED ORDER — OXYCODONE-ACETAMINOPHEN 5-325 MG PO TABS: 1.0000 | ORAL_TABLET | ORAL | 0 refills | Status: DC | PRN

## 2017-04-30 MED ORDER — FENTANYL CITRATE (PF) 100 MCG/2ML IJ SOLN
INTRAMUSCULAR | Status: AC
  Filled 2017-04-30: qty 2

## 2017-04-30 MED ORDER — FENTANYL CITRATE (PF) 100 MCG/2ML IJ SOLN: 25.0000 ug | INTRAMUSCULAR | Status: DC | PRN

## 2017-04-30 MED ORDER — FENTANYL CITRATE (PF) 100 MCG/2ML IJ SOLN
50.0000 ug | INTRAMUSCULAR | Status: DC | PRN
  Administered 2017-04-30: 50 ug via INTRAVENOUS
  Administered 2017-04-30: 100 ug via INTRAVENOUS

## 2017-04-30 MED ORDER — SCOPOLAMINE 1 MG/3DAYS TD PT72
1.0000 | MEDICATED_PATCH | Freq: Once | TRANSDERMAL | Status: AC | PRN
  Administered 2017-04-30: 1 via TRANSDERMAL
  Administered 2017-04-30: 1.5 mg via TRANSDERMAL

## 2017-04-30 MED ORDER — CEFAZOLIN SODIUM-DEXTROSE 2-4 GM/100ML-% IV SOLN
INTRAVENOUS | Status: AC
  Filled 2017-04-30: qty 100

## 2017-04-30 MED ORDER — LIDOCAINE 2% (20 MG/ML) 5 ML SYRINGE
INTRAMUSCULAR | Status: AC
  Filled 2017-04-30: qty 5

## 2017-04-30 MED ORDER — ACETAMINOPHEN 500 MG PO TABS
1000.0000 mg | ORAL_TABLET | Freq: Once | ORAL | Status: AC
  Administered 2017-04-30: 1000 mg via ORAL

## 2017-04-30 MED ORDER — AMOXICILLIN 875 MG PO TABS: 875.0000 mg | ORAL_TABLET | Freq: Two times a day (BID) | ORAL | 0 refills | Status: AC

## 2017-04-30 MED ORDER — PROMETHAZINE HCL 25 MG/ML IJ SOLN: 6.2500 mg | INTRAMUSCULAR | Status: DC | PRN

## 2017-04-30 MED ORDER — LIDOCAINE 2% (20 MG/ML) 5 ML SYRINGE
INTRAMUSCULAR | Status: DC | PRN
  Administered 2017-04-30: 80 mg via INTRAVENOUS

## 2017-04-30 MED ORDER — OXYMETAZOLINE HCL 0.05 % NA SOLN
NASAL | Status: DC | PRN
  Administered 2017-04-30: 1 via TOPICAL

## 2017-04-30 MED ORDER — MIDAZOLAM HCL 2 MG/2ML IJ SOLN
1.0000 mg | INTRAMUSCULAR | Status: DC | PRN
  Administered 2017-04-30: 2 mg via INTRAVENOUS

## 2017-04-30 MED ORDER — EPHEDRINE SULFATE 50 MG/ML IJ SOLN
INTRAMUSCULAR | Status: DC | PRN
  Administered 2017-04-30: 10 mg via INTRAVENOUS

## 2017-04-30 MED ORDER — ONDANSETRON HCL 4 MG/2ML IJ SOLN
INTRAMUSCULAR | Status: AC
  Filled 2017-04-30: qty 2

## 2017-04-30 MED ORDER — LIDOCAINE-EPINEPHRINE 1 %-1:100000 IJ SOLN
INTRAMUSCULAR | Status: DC | PRN
  Administered 2017-04-30: 1 mL

## 2017-04-30 MED ORDER — CEFAZOLIN SODIUM-DEXTROSE 2-3 GM-%(50ML) IV SOLR
INTRAVENOUS | Status: DC | PRN
  Administered 2017-04-30: 2 g via INTRAVENOUS

## 2017-04-30 MED ORDER — NEOSTIGMINE METHYLSULFATE 10 MG/10ML IV SOLN
INTRAVENOUS | Status: DC | PRN
  Administered 2017-04-30: 4 mg via INTRAVENOUS

## 2017-04-30 MED ORDER — MUPIROCIN 2 % EX OINT
TOPICAL_OINTMENT | CUTANEOUS | Status: DC | PRN
  Administered 2017-04-30: 1 via NASAL

## 2017-04-30 MED ORDER — DEXAMETHASONE SODIUM PHOSPHATE 4 MG/ML IJ SOLN
INTRAMUSCULAR | Status: DC | PRN
  Administered 2017-04-30: 10 mg via INTRAVENOUS

## 2017-04-30 MED FILL — AMOXICILLIN 875 MG TABLET: 875 | 5 days supply | Qty: 10 | Fill #0

## 2017-04-30 MED FILL — OXYCODONE-ACETAMINOPHEN 5-3: 5-325 | 3 days supply | Qty: 20 | Fill #0

## 2017-04-30 SURGICAL SUPPLY — 61 items
ATTRACTOMAT 16X20 MAGNETIC DRP (DRAPES) IMPLANT
BLADE RAD40 ROTATE 4M 4 5PK (BLADE) IMPLANT
BLADE RAD40 ROTATE 4M 4MM 5PK (BLADE)
BLADE RAD60 ROTATE M4 4 5PK (BLADE) IMPLANT
BLADE RAD60 ROTATE M4 4MM 5PK (BLADE)
BLADE ROTATE RAD 12 4 M4 (BLADE) IMPLANT
BLADE ROTATE RAD 12 4MM M4 (BLADE)
BLADE ROTATE RAD 40 4 M4 (BLADE) IMPLANT
BLADE ROTATE RAD 40 4MM M4 (BLADE)
BLADE ROTATE TRICUT 4MX13CM M4 (BLADE) ×1
BLADE ROTATE TRICUT 4X13 M4 (BLADE) ×2 IMPLANT
BLADE TRICUT ROTATE M4 4 5PK (BLADE) IMPLANT
BLADE TRICUT ROTATE M4 4MM 5PK (BLADE)
BUR HS RAD FRONTAL 3 (BURR) IMPLANT
BUR HS RAD FRONTAL 3MM (BURR)
CANISTER SUC SOCK COL 7IN (MISCELLANEOUS) ×6 IMPLANT
CANISTER SUCT 1200ML W/VALVE (MISCELLANEOUS) ×3 IMPLANT
COAGULATOR SUCT 6 FR SWTCH (ELECTROSURGICAL)
COAGULATOR SUCT 8FR VV (MISCELLANEOUS) ×2 IMPLANT
COAGULATOR SUCT SWTCH 10FR 6 (ELECTROSURGICAL) IMPLANT
DECANTER SPIKE VIAL GLASS SM (MISCELLANEOUS) IMPLANT
DRSG NASAL KENNEDY LMNT 8CM (GAUZE/BANDAGES/DRESSINGS) IMPLANT
DRSG NASOPORE 8CM (GAUZE/BANDAGES/DRESSINGS) ×2 IMPLANT
DRSG TELFA 3X8 NADH (GAUZE/BANDAGES/DRESSINGS) IMPLANT
ELECT REM PT RETURN 9FT ADLT (ELECTROSURGICAL) ×3
ELECTRODE REM PT RTRN 9FT ADLT (ELECTROSURGICAL) IMPLANT
GLOVE BIO SURGEON STRL SZ 6.5 (GLOVE) ×1 IMPLANT
GLOVE BIO SURGEON STRL SZ7.5 (GLOVE) ×3 IMPLANT
GLOVE BIO SURGEONS STRL SZ 6.5 (GLOVE) ×1
GOWN STRL REUS W/ TWL LRG LVL3 (GOWN DISPOSABLE) ×2 IMPLANT
GOWN STRL REUS W/TWL LRG LVL3 (GOWN DISPOSABLE) ×6
HEMOSTAT SURGICEL 2X14 (HEMOSTASIS) IMPLANT
IV NS 1000ML (IV SOLUTION)
IV NS 1000ML BAXH (IV SOLUTION) IMPLANT
IV NS 500ML (IV SOLUTION) ×3
IV NS 500ML BAXH (IV SOLUTION) ×1 IMPLANT
NDL HYPO 25X1 1.5 SAFETY (NEEDLE) ×1 IMPLANT
NDL PRECISIONGLIDE 27X1.5 (NEEDLE) ×1 IMPLANT
NDL SPNL 25GX3.5 QUINCKE BL (NEEDLE) IMPLANT
NEEDLE HYPO 25X1 1.5 SAFETY (NEEDLE) ×3 IMPLANT
NEEDLE PRECISIONGLIDE 27X1.5 (NEEDLE) ×3 IMPLANT
NEEDLE SPNL 25GX3.5 QUINCKE BL (NEEDLE) IMPLANT
NS IRRIG 1000ML POUR BTL (IV SOLUTION) ×2 IMPLANT
PACK BASIN DAY SURGERY FS (CUSTOM PROCEDURE TRAY) ×3 IMPLANT
PACK ENT DAY SURGERY (CUSTOM PROCEDURE TRAY) ×3 IMPLANT
PACKING NASAL EPIS 4X2.4 XEROG (MISCELLANEOUS) IMPLANT
PAD DRESSING TELFA 3X8 NADH (GAUZE/BANDAGES/DRESSINGS) IMPLANT
SLEEVE SCD COMPRESS KNEE MED (MISCELLANEOUS) IMPLANT
SOLUTION BUTLER CLEAR DIP (MISCELLANEOUS) ×3 IMPLANT
SPLINT NASAL AIRWAY SILICONE (MISCELLANEOUS) IMPLANT
SPONGE GAUZE 2X2 8PLY STER LF (GAUZE/BANDAGES/DRESSINGS) ×1
SPONGE GAUZE 2X2 8PLY STRL LF (GAUZE/BANDAGES/DRESSINGS) ×2 IMPLANT
SPONGE NEURO XRAY DETECT 1X3 (DISPOSABLE) ×3 IMPLANT
TOWEL OR 17X24 6PK STRL BLUE (TOWEL DISPOSABLE) ×3 IMPLANT
TRACKER ENT INSTRUMENT (MISCELLANEOUS) ×3 IMPLANT
TRACKER ENT PATIENT (MISCELLANEOUS) ×3 IMPLANT
TUBE CONNECTING 20'X1/4 (TUBING) ×1
TUBE CONNECTING 20X1/4 (TUBING) ×2 IMPLANT
TUBE SALEM SUMP 16 FR W/ARV (TUBING) ×2 IMPLANT
TUBING STRAIGHTSHOT EPS 5PK (TUBING) ×3 IMPLANT
YANKAUER SUCT BULB TIP NO VENT (SUCTIONS) ×3 IMPLANT

## 2017-04-30 NOTE — Discharge Instructions (Addendum)

## 2017-04-30 NOTE — H&P (Signed)
Cc: Frequent recurrent sinusitis  HPI: The patient is a 46 year old female who returns today complaining of more recurrent sinusitis.  The patient was last seen 2 months ago.  At that time, her recurrent sinusitis had improved.  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  The patient returns today complaining of more facial pain.  Currently her facial pain involves bilateral forehead, periorbital regions, and bilateral midface areas.  She was treated with levofloxacin and prednisone last week.  Despite her treatment, she continues to be symptomatic.   Exam:  The nasal cavities were decongested and anesthetised with a combination of oxymetazoline and 4% lidocaine solution.  The flexible scope was inserted into the right nasal cavity.  Endoscopy of the inferior and middle meatus was performed.  Edematous mucosa and purulent drainage were noted.  No polyp, mass, or lesion was appreciated.  Olfactory cleft was clear.  Nasopharynx was clear.  Turbinates were hypertrophied but without mass.  The procedure was repeated on the contralateral side with similar findings.  The patient tolerated the procedure well.  Instructions were given to avoid eating or drinking for 2 hours.    Assessment: 1.  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   2.  Mucopurulent drainage is noted from bilateral sinonasal cavities.  Her infection appears to involve bilateral maxillary, ethmoid, and frontal sinuses.     Plan:  1.  The nasal endoscopy findings are reviewed with the patient.  2.  The patient should complete her current course of levofloxacin and prednisone.  3.  In light of her frequent recurrent exacerbations, she will most likely benefit from undergoing bilateral endoscopic sinus surgery.  4.  The risks, benefits, alternatives, and details of the procedures are reviewed.  Questions are invited and answered.  5.  The  patient would like to proceed with the procedures.

## 2017-04-30 NOTE — Op Note (Signed)
DATE OF PROCEDURE: 04/30/2017  OPERATIVE REPORT   SURGEON: Newman PiesSu Zander Ingham, MD   PREOPERATIVE DIAGNOSES:  1. Bilateral chronic frontal, maxillary, and ethmoid sinuses  POSTOPERATIVE DIAGNOSES:  1. Bilateral chronic frontal, maxillary, and ethmoid sinuses  PROCEDURE PERFORMED:  1. Bilateral endoscopic frontal sinusotomy and total ethmoidectomy (CPT 78295-6231253-50) 2. Bilateral maxillary antrostomy (CPT C73605131256-50) 3. FUSION stereotactic image guidance (CPT [id number])  ANESTHESIA: General endotracheal tube anesthesia.   COMPLICATIONS: None.   ESTIMATED BLOOD LOSS: 200 mL.   INDICATION FOR PROCEDURE: Kathy Boyd is a 46 y.o. female with a history of chronic rhinosinusitis, with frequent recurrent exacerbations. The patient was previously treated with multiple courses of extended antibiotics, antihistamine, decongestant, steroid nasal spray, and systemic steroids. However, the patient continued to be symptomatic.  Her CT scan showed involvement of her frontal, maxillary, and ethmoid sinuses.  Based on the above findings, the decision was made for the patient to undergo the above-stated procedures. The risks, benefits, alternatives, and details of the procedure were discussed with the patient. Questions were invited and answered. Informed consent was obtained.   DESCRIPTION OF PROCEDURE: The patient was taken to the operating room and placed supine on the operating table. General endotracheal tube anesthesia was administered by the anesthesiologist. The patient was positioned, and prepped and draped in the standard fashion for nasal surgery. Pledgets soaked with Afrin were placed in both nasal cavities for decongestion. The pledgets were subsequently removed. The FUSION stereotactic image guidance marker was placed.  The image guidance system was functional throughout the case.  Using a 0 degree endoscope, the left nasal cavity was examined.  The left middle turbinate was carefully medialized.  The inferior  half of the middle turbinate was carefully resected.  The uncinate process was then removed with a Therapist, nutritionalreer elevator.  The maxillary opening was enlarged using a combination of Tru-Cut forceps and backbiter.  Mucoid fluid was suctioned from the maxillary sinus.  The bony partitions of the anterior and posterior ethmoid cavities were taken down using a combination of Tru-Cut forceps and microdebrider.  The frontal recess was then enlarged using a combination of Tru-Cut forceps and microdebrider.  All three sinuses were copiously irrigated with saline solution.  The same procedure was repeated on the right side without exceptions.  Similar findings were also noted on the right side.  Hemostasis was achieved using a suction cautery device and nasopore packing.  The care of the patient was turned over to the anesthesiologist. The patient was awakened from anesthesia without difficulty. The patient was extubated and transferred to the recovery room in good condition.   OPERATIVE FINDINGS: Bilateral chronic frontal, maxillary, and ethmoid sinusitis.  SPECIMEN: Bilateral sinus contents.  FOLLOWUP CARE: The patient be discharged home once she is awake and alert. The patient will be placed on Percocet 1 tablets p.o. q.4 hours p.r.n. pain, and amoxicillin 875 mg p.o. b.i.d. for 5 days. The patient will follow up in my office in approximately 1 week.  [name], MD

## 2017-04-30 NOTE — Transfer of Care (Signed)
Immediate Anesthesia Transfer of Care Note  Patient: Kathy Boyd  Procedure(s) Performed: BILATERAL ENDOSCOPIC MAXILLARY ANTROSTOMY (Bilateral Nose) BILATERAL TOTAL ETHMOIDECTOMY (Bilateral Nose) BILATERAL  FRONTAL SINUS EXPLORATION (Bilateral Nose)  Patient Location: PACU  Anesthesia Type:General  Level of Consciousness: awake and patient cooperative  Airway & Oxygen Therapy: Patient Spontanous Breathing and Patient connected to face mask oxygen  Post-op Assessment: Report given to RN and Post -op Vital signs reviewed and stable  Post vital signs: Reviewed and stable  Last Vitals:  Vitals:   04/30/17 0830 04/30/17 0858  BP: 113/72   Pulse: 79 79  Resp: 18   Temp: (!) 36.3 C 36.8 C  SpO2: 98%     Last Pain:  Vitals:   04/30/17 0858  TempSrc: Oral         Complications: No apparent anesthesia complications

## 2017-04-30 NOTE — Anesthesia Preprocedure Evaluation (Addendum)
Anesthesia Evaluation  Patient identified by MRN, date of birth, ID band Patient awake  General Assessment Comment:Chronic maxillary sinusitis, chronic ethmoid sinusitis, chronic frontal sinusitis   Reviewed: Allergy & Precautions, NPO status , Patient's Chart, lab work & pertinent test results  Airway Mallampati: II  TM Distance: <3 FB Neck ROM: Full    Dental  (+) Teeth Intact, Dental Advisory Given   Pulmonary neg pulmonary ROS,    Pulmonary exam normal breath sounds clear to auscultation       Cardiovascular Exercise Tolerance: Good negative cardio ROS Normal cardiovascular exam Rhythm:Regular Rate:Normal     Neuro/Psych negative neurological ROS  negative psych ROS   GI/Hepatic negative GI ROS, Neg liver ROS,   Endo/Other  Obesity   Renal/GU negative Renal ROS     Musculoskeletal negative musculoskeletal ROS (+)   Abdominal   Peds  Hematology negative hematology ROS (+)   Anesthesia Other Findings Day of surgery medications reviewed with the patient.  Reproductive/Obstetrics                            Anesthesia Physical Anesthesia Plan  ASA: II  Anesthesia Plan: General   Post-op Pain Management:    Induction: Intravenous  PONV Risk Score and Plan: 4 or greater and Scopolamine patch - Pre-op, Midazolam, Dexamethasone and Ondansetron  Airway Management Planned: Oral ETT  Additional Equipment:   Intra-op Plan:   Post-operative Plan: Extubation in OR  Informed Consent: I have reviewed the patients History and Physical, chart, labs and discussed the procedure including the risks, benefits and alternatives for the proposed anesthesia with the patient or authorized representative who has indicated his/her understanding and acceptance.   Dental advisory given  Plan Discussed with: CRNA  Anesthesia Plan Comments: (Risks/benefits of general anesthesia discussed with patient  including risk of damage to teeth, lips, gum, and tongue, nausea/vomiting, allergic reactions to medications, and the possibility of heart attack, stroke and death.  All patient questions answered.  Patient wishes to proceed.)        Anesthesia Quick Evaluation

## 2017-04-30 NOTE — Anesthesia Procedure Notes (Signed)
Procedure Name: Intubation Date/Time: 04/30/2017 10:27 AM Performed by: Maryella Shivers, CRNA Pre-anesthesia Checklist: Patient identified, Emergency Drugs available, Suction available and Patient being monitored Patient Re-evaluated:Patient Re-evaluated prior to induction Oxygen Delivery Method: Circle system utilized Preoxygenation: Pre-oxygenation with 100% oxygen Induction Type: IV induction Ventilation: Mask ventilation without difficulty Laryngoscope Size: Mac and 3 Grade View: Grade I Tube type: Oral Tube size: 7.0 mm Number of attempts: 1 Airway Equipment and Method: Stylet and Oral airway Placement Confirmation: ETT inserted through vocal cords under direct vision,  positive ETCO2 and breath sounds checked- equal and bilateral Secured at: 21 cm Tube secured with: Tape Dental Injury: Teeth and Oropharynx as per pre-operative assessment

## 2017-04-30 NOTE — Anesthesia Postprocedure Evaluation (Signed)
Anesthesia Post Note  Patient: Kathy Boyd  Procedure(s) Performed: BILATERAL ENDOSCOPIC MAXILLARY ANTROSTOMY (Bilateral Nose) BILATERAL TOTAL ETHMOIDECTOMY (Bilateral Nose) BILATERAL  FRONTAL SINUS EXPLORATION (Bilateral Nose)     Patient location during evaluation: PACU Anesthesia Type: General Level of consciousness: awake and alert Pain management: pain level controlled Vital Signs Assessment: post-procedure vital signs reviewed and stable Respiratory status: spontaneous breathing, nonlabored ventilation and respiratory function stable Cardiovascular status: blood pressure returned to baseline and stable Postop Assessment: no apparent nausea or vomiting Anesthetic complications: no    Last Vitals:  Vitals:   04/30/17 1215 04/30/17 1224  BP: (!) 104/38 126/74  Pulse: 81 85  Resp: (!) 21 20  Temp:  36.7 C  SpO2: 96% 96%    Last Pain:  Vitals:   04/30/17 1224  TempSrc: Oral  PainSc: 3                  Cecile HearingStephen Edward Turk

## 2017-05-01 ENCOUNTER — Encounter (HOSPITAL_BASED_OUTPATIENT_CLINIC_OR_DEPARTMENT_OTHER): Payer: Self-pay | Admitting: Otolaryngology

## 2017-05-03 NOTE — Telephone Encounter (Signed)
No Answer/Busy

## 2017-05-05 ENCOUNTER — Ambulatory Visit (INDEPENDENT_AMBULATORY_CARE_PROVIDER_SITE_OTHER): Payer: 59 | Admitting: Otolaryngology

## 2017-05-05 DIAGNOSIS — J321 Chronic frontal sinusitis: Secondary | ICD-10-CM

## 2017-05-05 DIAGNOSIS — J322 Chronic ethmoidal sinusitis: Secondary | ICD-10-CM | POA: Diagnosis not present

## 2017-05-05 DIAGNOSIS — J320 Chronic maxillary sinusitis: Secondary | ICD-10-CM

## 2017-05-12 MED FILL — levoFLOXacin 500 MG TABS: 500 | 7 days supply | Qty: 7 | Fill #3

## 2017-05-12 MED FILL — predniSONE 10 MG (21) TBPK: 10 | 6 days supply | Qty: 21 | Fill #3

## 2017-05-20 DIAGNOSIS — J322 Chronic ethmoidal sinusitis: Secondary | ICD-10-CM | POA: Diagnosis not present

## 2017-05-20 DIAGNOSIS — J320 Chronic maxillary sinusitis: Secondary | ICD-10-CM | POA: Diagnosis not present

## 2017-05-20 DIAGNOSIS — J321 Chronic frontal sinusitis: Secondary | ICD-10-CM | POA: Diagnosis not present

## 2017-05-23 MED FILL — levoFLOXacin 500 MG TABS: 500 | 7 days supply | Qty: 7 | Fill #4

## 2017-05-23 MED FILL — predniSONE 10 MG (21) TBPK: 10 | 6 days supply | Qty: 21 | Fill #4

## 2017-05-23 MED FILL — TRIAMTERENE-HCTZ 75-50 MG T: 75-50 | 90 days supply | Qty: 90 | Fill #0

## 2017-06-24 DIAGNOSIS — J310 Chronic rhinitis: Secondary | ICD-10-CM | POA: Diagnosis not present

## 2017-06-24 DIAGNOSIS — J320 Chronic maxillary sinusitis: Secondary | ICD-10-CM | POA: Diagnosis not present

## 2017-06-24 MED FILL — SPRINTEC 28 DAY TABLET: 0.25-35 | 84 days supply | Qty: 84 | Fill #1

## 2017-06-24 MED FILL — FLUTICASONE PROP 50 MCG SPR: 50 | 30 days supply | Qty: 16 | Fill #6

## 2017-07-21 MED FILL — FLUTICASONE PROP 50 MCG SPR: 50 | 30 days supply | Qty: 16 | Fill #0

## 2017-07-29 ENCOUNTER — Other Ambulatory Visit: Payer: Self-pay | Admitting: Obstetrics and Gynecology

## 2017-07-29 DIAGNOSIS — Z139 Encounter for screening, unspecified: Secondary | ICD-10-CM

## 2017-08-27 MED FILL — FLUTICASONE PROP 50 MCG SPR: 50 | 30 days supply | Qty: 16 | Fill #1

## 2017-09-12 ENCOUNTER — Ambulatory Visit
Admission: RE | Admit: 2017-09-12 | Discharge: 2017-09-12 | Disposition: A | Discharge status: Home / Self Care | Payer: 59 | Source: Ambulatory Visit | Attending: Obstetrics and Gynecology | Admitting: Obstetrics and Gynecology

## 2017-09-12 DIAGNOSIS — Z139 Encounter for screening, unspecified: Secondary | ICD-10-CM

## 2017-09-12 DIAGNOSIS — Z1231 Encounter for screening mammogram for malignant neoplasm of breast: Secondary | ICD-10-CM | POA: Diagnosis not present

## 2017-09-22 MED FILL — ESTARYLLA 0.25-35 MG-MCG TA: 0.25-35 | 84 days supply | Qty: 84 | Fill #2

## 2017-09-23 DIAGNOSIS — J320 Chronic maxillary sinusitis: Secondary | ICD-10-CM | POA: Diagnosis not present

## 2017-09-23 DIAGNOSIS — J310 Chronic rhinitis: Secondary | ICD-10-CM | POA: Diagnosis not present

## 2017-09-27 MED FILL — FLUTICASONE PROP 50 MCG SPR: 50 | 30 days supply | Qty: 16 | Fill #2

## 2017-10-29 MED FILL — FLUTICASONE PROP 50 MCG SPR: 50 | 30 days supply | Qty: 16 | Fill #3

## 2017-10-29 MED FILL — TRIAMTERENE-HCTZ 75/50 TAB: 75-50 | 90 days supply | Qty: 90 | Fill #1

## 2017-11-10 MED FILL — PENICILLIN VK 500 MG TABLET: 500 | 7 days supply | Qty: 28 | Fill #0

## 2017-11-10 MED FILL — NAPROXEN 500 MG TABLET: 500 | 4 days supply | Qty: 12 | Fill #0

## 2017-11-21 DIAGNOSIS — R600 Localized edema: Secondary | ICD-10-CM | POA: Diagnosis not present

## 2017-11-21 DIAGNOSIS — R5383 Other fatigue: Secondary | ICD-10-CM | POA: Diagnosis not present

## 2017-11-21 DIAGNOSIS — Z6841 Body Mass Index (BMI) 40.0 and over, adult: Secondary | ICD-10-CM | POA: Diagnosis not present

## 2017-11-21 DIAGNOSIS — Z13220 Encounter for screening for lipoid disorders: Secondary | ICD-10-CM | POA: Diagnosis not present

## 2017-11-21 DIAGNOSIS — Z0000 Encounter for general adult medical examination without abnormal findings: Secondary | ICD-10-CM | POA: Diagnosis not present

## 2017-12-16 MED FILL — FLUTICASONE PROP 50 MCG SPR: 50 | 30 days supply | Qty: 16 | Fill #4

## 2017-12-16 MED FILL — FEMYNOR 0.25-35 MG-MCG TABS: 0.25-35 | 84 days supply | Qty: 84 | Fill #3

## 2018-01-15 ENCOUNTER — Telehealth: Payer: 59 | Admitting: Family

## 2018-01-15 DIAGNOSIS — J329 Chronic sinusitis, unspecified: Secondary | ICD-10-CM | POA: Diagnosis not present

## 2018-01-15 DIAGNOSIS — B9689 Other specified bacterial agents as the cause of diseases classified elsewhere: Secondary | ICD-10-CM

## 2018-01-15 MED ORDER — AMOXICILLIN-POT CLAVULANATE 875-125 MG PO TABS: 1.0000 | ORAL_TABLET | Freq: Two times a day (BID) | ORAL | 0 refills | Status: AC

## 2018-01-15 MED FILL — AMOX-CLAV 875-125 MG TABLET: 875-125 | 7 days supply | Qty: 14 | Fill #0

## 2018-01-15 NOTE — Progress Notes (Signed)

## 2018-01-18 MED FILL — FLUTICASONE PROP 50 MCG SPR: 50 | 30 days supply | Qty: 16 | Fill #5

## 2018-01-26 ENCOUNTER — Ambulatory Visit (INDEPENDENT_AMBULATORY_CARE_PROVIDER_SITE_OTHER): Payer: 59 | Admitting: Otolaryngology

## 2018-01-26 DIAGNOSIS — R42 Dizziness and giddiness: Secondary | ICD-10-CM

## 2018-01-26 DIAGNOSIS — J0111 Acute recurrent frontal sinusitis: Secondary | ICD-10-CM

## 2018-01-26 DIAGNOSIS — J310 Chronic rhinitis: Secondary | ICD-10-CM | POA: Diagnosis not present

## 2018-02-23 DIAGNOSIS — Z6841 Body Mass Index (BMI) 40.0 and over, adult: Secondary | ICD-10-CM | POA: Diagnosis not present

## 2018-02-23 DIAGNOSIS — Z01419 Encounter for gynecological examination (general) (routine) without abnormal findings: Secondary | ICD-10-CM | POA: Diagnosis not present

## 2018-02-23 MED FILL — NORETHIND-ETH ESTRAD 1-0.02: 1-20 | 28 days supply | Qty: 21 | Fill #0

## 2018-02-23 MED FILL — FLUTICASONE PROP 50 MCG SPR: 50 | 30 days supply | Qty: 16 | Fill #6

## 2018-02-26 ENCOUNTER — Ambulatory Visit (INDEPENDENT_AMBULATORY_CARE_PROVIDER_SITE_OTHER): Payer: 59 | Admitting: Otolaryngology

## 2018-02-26 DIAGNOSIS — J322 Chronic ethmoidal sinusitis: Secondary | ICD-10-CM

## 2018-02-26 DIAGNOSIS — J321 Chronic frontal sinusitis: Secondary | ICD-10-CM | POA: Diagnosis not present

## 2018-03-17 MED FILL — NORETHIND-ETH ESTRAD 1-0.02: 1-20 | 84 days supply | Qty: 63 | Fill #1

## 2018-03-24 DIAGNOSIS — N943 Premenstrual tension syndrome: Secondary | ICD-10-CM | POA: Diagnosis not present

## 2018-03-24 DIAGNOSIS — Z6841 Body Mass Index (BMI) 40.0 and over, adult: Secondary | ICD-10-CM | POA: Diagnosis not present

## 2018-03-24 MED FILL — FLUTICASONE PROP 50 MCG SPR: 50 | 30 days supply | Qty: 16 | Fill #7

## 2018-03-24 MED FILL — FLUoxetine HCL 20 MG CAPS: 20 | 90 days supply | Qty: 90 | Fill #0

## 2018-04-26 MED FILL — FLUTICASONE PROP 50 MCG SPR: 50 | 30 days supply | Qty: 16 | Fill #8

## 2018-06-01 MED FILL — NORETHIND-ETH ESTRAD 1-0.02: 1-20 | 84 days supply | Qty: 63 | Fill #2

## 2018-06-01 MED FILL — FLUTICASONE PROP 50 MCG SPR: 50 | 30 days supply | Qty: 16 | Fill #9

## 2018-06-29 MED FILL — FLUoxetine HCL 20 MG CAPS: 20 | 30 days supply | Qty: 30 | Fill #0

## 2018-07-13 ENCOUNTER — Ambulatory Visit (INDEPENDENT_AMBULATORY_CARE_PROVIDER_SITE_OTHER): Payer: 59 | Admitting: Otolaryngology

## 2018-07-13 DIAGNOSIS — J321 Chronic frontal sinusitis: Secondary | ICD-10-CM | POA: Diagnosis not present

## 2018-07-13 DIAGNOSIS — J310 Chronic rhinitis: Secondary | ICD-10-CM

## 2018-07-23 MED FILL — FLUoxetine HCL 20 MG CAPS: 20 | 90 days supply | Qty: 90 | Fill #0

## 2018-07-24 MED FILL — FLUTICASONE PROP 50 MCG SPR: 50 | 30 days supply | Qty: 16 | Fill #0

## 2018-07-30 ENCOUNTER — Other Ambulatory Visit: Payer: Self-pay | Admitting: Obstetrics and Gynecology

## 2018-07-30 DIAGNOSIS — Z1231 Encounter for screening mammogram for malignant neoplasm of breast: Secondary | ICD-10-CM

## 2018-09-03 MED FILL — NORETHIND-ETH ESTRAD 1-0.02: 1-20 | 84 days supply | Qty: 63 | Fill #3

## 2018-09-18 ENCOUNTER — Other Ambulatory Visit: Payer: Self-pay

## 2018-09-18 ENCOUNTER — Ambulatory Visit
Admission: RE | Admit: 2018-09-18 | Discharge: 2018-09-18 | Disposition: A | Discharge status: Home / Self Care | Payer: 59 | Source: Ambulatory Visit | Attending: Obstetrics and Gynecology | Admitting: Obstetrics and Gynecology

## 2018-09-18 DIAGNOSIS — Z1231 Encounter for screening mammogram for malignant neoplasm of breast: Secondary | ICD-10-CM

## 2018-09-24 ENCOUNTER — Ambulatory Visit: Payer: 59

## 2018-10-31 MED FILL — FLUTICASONE PROP 50 MCG SPR: 50 | 30 days supply | Qty: 16 | Fill #1

## 2018-10-31 MED FILL — FLUoxetine HCL 20 MG CAPS: 20 | 30 days supply | Qty: 30 | Fill #0

## 2018-11-09 DIAGNOSIS — N943 Premenstrual tension syndrome: Secondary | ICD-10-CM | POA: Diagnosis not present

## 2018-11-10 MED FILL — NORETHIND-ETH ESTRAD 1-0.02: 1-20 | 84 days supply | Qty: 63 | Fill #4

## 2018-11-27 ENCOUNTER — Telehealth: Payer: 59 | Admitting: Family

## 2018-11-27 DIAGNOSIS — L710 Perioral dermatitis: Secondary | ICD-10-CM

## 2018-11-27 MED ORDER — CEPHALEXIN 500 MG PO CAPS: 500.0000 mg | ORAL_CAPSULE | Freq: Three times a day (TID) | ORAL | 0 refills | Status: DC

## 2018-11-27 MED FILL — FLUoxetine HCL 20 MG CAPS: 20 | 90 days supply | Qty: 90 | Fill #0

## 2018-11-27 MED FILL — FLUTICASONE PROP 50 MCG SPR: 50 | 30 days supply | Qty: 16 | Fill #2

## 2018-11-27 MED FILL — CEPHALEXIN 500 MG CAPSULE: 500 | 7 days supply | Qty: 21 | Fill #0

## 2018-11-27 NOTE — Progress Notes (Signed)
E Visit for Rash  We are sorry that you are not feeling well. Here is how we plan to help!   It appears that you have perioral dermatitis. It is a common condition that requires antibiotic treatment. Keflex 500 mg three times per day for 7 days    HOME CARE:   Take cool showers and avoid direct sunlight.  Apply cool compress or wet dressings.  Take a bath in an oatmeal bath.  Sprinkle content of one Aveeno packet under running faucet with comfortably warm water.  Bathe for 15-20 minutes, 1-2 times daily.  Pat dry with a towel. Do not rub the rash.  Use hydrocortisone cream.  Take an antihistamine like Benadryl for widespread rashes that itch.  The adult dose of Benadryl is 25-50 mg by mouth 4 times daily.  Caution:  This type of medication may cause sleepiness.  Do not drink alcohol, drive, or operate dangerous machinery while taking antihistamines.  Do not take these medications if you have prostate enlargement.  Read package instructions thoroughly on all medications that you take.  GET HELP RIGHT AWAY IF:   Symptoms don't go away after treatment.  Severe itching that persists.  If you rash spreads or swells.  If you rash begins to smell.  If it blisters and opens or develops a yellow-brown crust.  You develop a fever.  You have a sore throat.  You become short of breath.  MAKE SURE YOU:  Understand these instructions. Will watch your condition. Will get help right away if you are not doing well or get worse.  Thank you for choosing an e-visit. Your e-visit answers were reviewed by a board certified advanced clinical practitioner to complete your personal care plan. Depending upon the condition, your plan could have included both over the counter or prescription medications. Please review your pharmacy choice. Be sure that the pharmacy you have chosen is open so that you can pick up your prescription now.  If there is a problem you may message your provider in  [place] to have the prescription routed to another pharmacy. Your safety is important to [place]. If you have drug allergies check your prescription carefully.  For the next 24 hours, you can use MyChart to ask questions about today's visit, request a non-urgent call back, or ask for a work or school excuse from your e-visit provider. You will get an email in the next two days asking about your experience. I hope that your e-visit has been valuable and will speed your recovery.    Greater than 5 minutes, yet less than 10 minutes of time have been spent researching, coordinating, and implementing care for this patient today.  Thank you for the details you included in the comment boxes. Those details are very helpful in determining the best course of treatment for you and help [place] to provide the best care.

## 2018-12-07 ENCOUNTER — Encounter: Payer: Self-pay | Admitting: Physician Assistant

## 2018-12-07 ENCOUNTER — Telehealth: Payer: 59 | Admitting: Physician Assistant

## 2018-12-07 DIAGNOSIS — B002 Herpesviral gingivostomatitis and pharyngotonsillitis: Secondary | ICD-10-CM

## 2018-12-07 MED ORDER — ACYCLOVIR 400 MG PO TABS: 400.0000 mg | ORAL_TABLET | Freq: Three times a day (TID) | ORAL | 0 refills | Status: DC

## 2018-12-07 MED FILL — ACYCLOVIR 400 MG TABLET: 400 | 5 days supply | Qty: 15 | Fill #0

## 2018-12-07 NOTE — Progress Notes (Signed)
E Visit for Rash  We are sorry that you are not feeling well. Here is how we plan to help!  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 You have also indicated that steroid cream did not help with rash and prescribed antibiotic did not clear rash. I have prescribed Acyclovir 400mg  take one pill three times daily for 5 days. You may have follow up labs with your doctor as advised.   HOME CARE:   Take cool showers and avoid direct sunlight.  Apply cool compress or wet dressings.  Take a bath in an oatmeal bath.  Sprinkle content of one Aveeno packet under running faucet with comfortably warm water.  Bathe for 15-20 minutes, 1-2 times daily.  Pat dry with a towel. Do not rub the rash.  Use hydrocortisone cream.  Take an antihistamine like Benadryl for widespread rashes that itch.  The adult dose of Benadryl is 25-50 mg by mouth 4 times daily.  Caution:  This type of medication may cause sleepiness.  Do not drink alcohol, drive, or operate dangerous machinery while taking antihistamines.  Do not take these medications if you have prostate enlargement.  Read package instructions thoroughly on all medications that you take.  GET HELP RIGHT AWAY IF:   Symptoms don't go away after treatment.  Severe itching that persists.  If you rash spreads or swells.  If you rash begins to smell.  If it blisters and opens or develops a yellow-brown crust.  You develop a fever.  You have a sore throat.  You become short of breath.  MAKE SURE YOU:  Understand these instructions. Will watch your condition. Will get help right away if you are not doing well or get worse.  Thank you for choosing an e-visit. Your e-visit answers were reviewed by a board certified advanced clinical practitioner to complete your personal care plan. Depending upon the condition, your plan could have included both over the counter or prescription  medications. Please review your pharmacy choice. Be sure that the pharmacy you have chosen is open so that you can pick up your prescription now.  If there is a problem you may message your provider in [place] to have the prescription routed to another pharmacy. Your safety is important to [place]. If you have drug allergies check your prescription carefully.  For the next 24 hours, you can use MyChart to ask questions about today's visit, request a non-urgent call back, or ask for a work or school excuse from your e-visit provider. You will get an email in the next two days asking about your experience. I hope that your e-visit has been valuable and will speed your recovery.     I spent 5-10 minutes on review and completion of this note- Lacy Duverney [facility]

## 2018-12-18 DIAGNOSIS — L710 Perioral dermatitis: Secondary | ICD-10-CM | POA: Diagnosis not present

## 2018-12-18 MED FILL — metroNIDAZOLE 0.75 % GEL: 0.75 | 30 days supply | Qty: 45 | Fill #0

## 2018-12-18 MED FILL — DOXYCYCLINE HYCLATE 100 MG: 100 | 30 days supply | Qty: 60 | Fill #0

## 2018-12-20 ENCOUNTER — Telehealth: Payer: 59 | Admitting: Physician Assistant

## 2018-12-20 DIAGNOSIS — R21 Rash and other nonspecific skin eruption: Secondary | ICD-10-CM

## 2018-12-20 DIAGNOSIS — H1012 Acute atopic conjunctivitis, left eye: Secondary | ICD-10-CM | POA: Diagnosis not present

## 2018-12-20 NOTE — Progress Notes (Signed)
Thank you for taking the time to respond back to my message. Based on what you shared with me, I feel your condition warrants further evaluation and I recommend that you be seen for a face to face office visit.  Considering you have tried multiple medications without full relief, you may warrant additional testing at this time and therefore need a face to face evaluation. Please contact your primary care provider to schedule and appointment. If you cannot be seen by your primary care provider, I have provided you with a list of local urgent care offices.   NOTE: If you entered your credit card information for this eVisit, you will not be charged. You may see a "hold" on your card for the $35 but that hold will drop off and you will not have a charge processed.  If you are having a true medical emergency please call 911.     The following sites will take your insurance:  . [facility]    [phone]                  Get Driving Directions  [address], [place] [address] . 10 am to 8 pm Monday-Friday . 12 pm to 8 pm Saturday-Sunday   . [facility] at [place]                  Get Driving Directions  [address], [place], [place] [address] . 8 am to 8 pm Monday-Friday . 9 am to 6 pm Saturday . 11 am to 6 pm Sunday   . [facility] at [place]                  Get Driving Directions   [address], [place] [address] . 8 am to 8 pm Monday-Friday . 8 am to 4 pm Saturday-Sunday    . [facility] at [place]                    Get Driving Directions  [phone]  [address] [place],  [address]  . Monday-Friday, 12 PM to 6 PM    Your e-visit answers were reviewed by a board certified advanced clinical practitioner to complete your personal care plan.  Thank you for using e-Visits.  I have spent 5 minutes in  review of e-visit questionnaire, review and updating patient chart, medical decision making and response to patient.    Tenna Delaine, PA-C

## 2019-01-21 ENCOUNTER — Ambulatory Visit (INDEPENDENT_AMBULATORY_CARE_PROVIDER_SITE_OTHER): Payer: 59 | Admitting: Otolaryngology

## 2019-01-21 DIAGNOSIS — J310 Chronic rhinitis: Secondary | ICD-10-CM

## 2019-01-21 DIAGNOSIS — J321 Chronic frontal sinusitis: Secondary | ICD-10-CM

## 2019-02-05 MED FILL — NORETHIND-ETH ESTRAD 1-0.02: 1-20 | 84 days supply | Qty: 63 | Fill #0

## 2019-02-22 MED FILL — FLUoxetine HCL 20 MG CAPS: 20 | 90 days supply | Qty: 90 | Fill #1

## 2019-02-25 DIAGNOSIS — Z01419 Encounter for gynecological examination (general) (routine) without abnormal findings: Secondary | ICD-10-CM | POA: Diagnosis not present

## 2019-02-25 DIAGNOSIS — Z6841 Body Mass Index (BMI) 40.0 and over, adult: Secondary | ICD-10-CM | POA: Diagnosis not present

## 2019-05-03 MED FILL — NORETHIND-ETH ESTRAD 1-0.02: 1-20 | 84 days supply | Qty: 63 | Fill #0

## 2019-05-24 MED FILL — FLUoxetine HCL 20 MG CAPS: 20 | 90 days supply | Qty: 90 | Fill #2

## 2019-07-26 MED FILL — NORETHIND-ETH ESTRAD 1-0.02: 1-20 | 84 days supply | Qty: 63 | Fill #1

## 2019-08-20 MED FILL — FLUoxetine HCL 20 MG CAPS: 20 | 90 days supply | Qty: 90 | Fill #3

## 2019-08-23 ENCOUNTER — Other Ambulatory Visit: Payer: Self-pay | Admitting: Obstetrics and Gynecology

## 2019-08-23 DIAGNOSIS — Z1231 Encounter for screening mammogram for malignant neoplasm of breast: Secondary | ICD-10-CM

## 2019-08-27 ENCOUNTER — Telehealth: Payer: 59 | Admitting: Physician Assistant

## 2019-08-27 ENCOUNTER — Encounter: Payer: Self-pay | Admitting: Physician Assistant

## 2019-08-27 DIAGNOSIS — J019 Acute sinusitis, unspecified: Secondary | ICD-10-CM

## 2019-08-27 MED ORDER — AMOXICILLIN-POT CLAVULANATE 875-125 MG PO TABS: 1.0000 | ORAL_TABLET | Freq: Two times a day (BID) | ORAL | 0 refills | Status: DC

## 2019-08-27 NOTE — Progress Notes (Signed)
We are sorry that you are not feeling well.  Here is how we plan to help!  Based on what you have shared with me it looks like you have sinusitis.  Sinusitis is inflammation and infection in the sinus cavities of the head.  Based on your presentation I believe you most likely have Acute Bacterial Sinusitis.  This is an infection caused by bacteria and is treated with antibiotics. I have prescribed Augmentin 875mg/125mg one tablet twice daily with food, for 7 days. You may use an oral decongestant such as Mucinex D or if you have glaucoma or high blood pressure use plain Mucinex. Saline nasal spray help and can safely be used as often as needed for congestion.  If you develop worsening sinus pain, fever or notice severe headache and vision changes, or if symptoms are not better after completion of antibiotic, please schedule an appointment with a health care provider.    Sinus infections are not as easily transmitted as other respiratory infection, however we still recommend that you avoid close contact with loved ones, especially the very young and elderly.  Remember to wash your hands thoroughly throughout the day as this is the number one way to prevent the spread of infection!  Home Care:  Only take medications as instructed by your medical team.  Complete the entire course of an antibiotic.  Do not take these medications with alcohol.  A steam or ultrasonic humidifier can help congestion.  You can place a towel over your head and breathe in the steam from hot water coming from a faucet.  Avoid close contacts especially the very young and the elderly.  Cover your mouth when you cough or sneeze.  Always remember to wash your hands.  Get Help Right Away If:  You develop worsening fever or sinus pain.  You develop a severe head ache or visual changes.  Your symptoms persist after you have completed your treatment plan.  Make sure you  Understand these instructions.  Will watch your  condition.  Will get help right away if you are not doing well or get worse.  Your e-visit answers were reviewed by a board certified advanced clinical practitioner to complete your personal care plan.  Depending on the condition, your plan could have included both over the counter or prescription medications.  If there is a problem please reply  once you have received a response from your provider.  Your safety is important to us.  If you have drug allergies check your prescription carefully.    You can use MyChart to ask questions about today's visit, request a non-urgent call back, or ask for a work or school excuse for 24 hours related to this e-Visit. If it has been greater than 24 hours you will need to follow up with your provider, or enter a new e-Visit to address those concerns.  You will get an e-mail in the next two days asking about your experience.  I hope that your e-visit has been valuable and will speed your recovery. Thank you for using e-visits.   I spent 5-10 minutes on review and completion of this note- Kishana Battey PAC  

## 2019-09-20 ENCOUNTER — Ambulatory Visit
Admission: RE | Admit: 2019-09-20 | Discharge: 2019-09-20 | Disposition: A | Discharge status: Home / Self Care | Payer: 59 | Source: Ambulatory Visit

## 2019-09-20 ENCOUNTER — Other Ambulatory Visit: Payer: Self-pay

## 2019-09-20 DIAGNOSIS — Z1231 Encounter for screening mammogram for malignant neoplasm of breast: Secondary | ICD-10-CM | POA: Diagnosis not present

## 2019-09-21 MED FILL — metroNIDAZOLE 0.75 % GEL: 0.75 | 30 days supply | Qty: 45 | Fill #1

## 2019-09-21 MED FILL — DOXYCYCLINE HYCLATE 100 MG: 100 | 30 days supply | Qty: 60 | Fill #1

## 2019-10-19 MED FILL — NORETHIND-ETH ESTRAD 1-0.02: 1-20 | 84 days supply | Qty: 63 | Fill #2

## 2019-11-11 MED FILL — FLUoxetine HCL 20 MG CAPS: 20 | 90 days supply | Qty: 90 | Fill #0

## 2019-11-12 ENCOUNTER — Other Ambulatory Visit (HOSPITAL_COMMUNITY): Payer: Self-pay | Admitting: Family Medicine

## 2020-01-06 ENCOUNTER — Telehealth: Payer: 59 | Admitting: Physician Assistant

## 2020-01-06 DIAGNOSIS — J019 Acute sinusitis, unspecified: Secondary | ICD-10-CM

## 2020-01-06 MED ORDER — AMOXICILLIN-POT CLAVULANATE 875-125 MG PO TABS: 1.0000 | ORAL_TABLET | Freq: Two times a day (BID) | ORAL | 0 refills | Status: DC

## 2020-01-06 NOTE — Progress Notes (Signed)

## 2020-01-07 MED FILL — NORETHIND-ETH ESTRAD 1-0.02: 1-20 | 84 days supply | Qty: 63 | Fill #3

## 2020-02-17 MED FILL — FLUoxetine HCL 20 MG CAPS: 20 | 90 days supply | Qty: 90 | Fill #0

## 2020-02-28 ENCOUNTER — Other Ambulatory Visit (HOSPITAL_COMMUNITY): Payer: Self-pay | Admitting: Obstetrics and Gynecology

## 2020-02-28 DIAGNOSIS — Z01419 Encounter for gynecological examination (general) (routine) without abnormal findings: Secondary | ICD-10-CM | POA: Diagnosis not present

## 2020-02-28 DIAGNOSIS — Z6841 Body Mass Index (BMI) 40.0 and over, adult: Secondary | ICD-10-CM | POA: Diagnosis not present

## 2020-03-31 MED FILL — NORETHIND-ETH ESTRAD 1-0.02: 1-20 | 84 days supply | Qty: 84 | Fill #0

## 2020-04-04 ENCOUNTER — Telehealth: Payer: 59 | Admitting: Emergency Medicine

## 2020-04-04 DIAGNOSIS — J3489 Other specified disorders of nose and nasal sinuses: Secondary | ICD-10-CM | POA: Diagnosis not present

## 2020-04-04 DIAGNOSIS — H9201 Otalgia, right ear: Secondary | ICD-10-CM | POA: Diagnosis not present

## 2020-04-04 MED ORDER — AMOXICILLIN-POT CLAVULANATE 875-125 MG PO TABS: 1.0000 | ORAL_TABLET | Freq: Two times a day (BID) | ORAL | 0 refills | Status: DC

## 2020-04-04 MED ORDER — CIPROFLOXACIN-DEXAMETHASONE 0.3-0.1 % OT SUSP: 4.0000 [drp] | Freq: Two times a day (BID) | OTIC | 0 refills | Status: DC

## 2020-04-04 NOTE — Progress Notes (Signed)
Time spent: 10 min  We are sorry that you are not feeling well.  Here is how we plan to help!  Based on what you have shared with me it looks like you have sinusitis.  Sinusitis is inflammation and infection in the sinus cavities of the head.  Based on your presentation I believe you most likely have Acute Bacterial Sinusitis.  This is an infection caused by bacteria and is treated with antibiotics. I have prescribed Augmentin 875mg /125mg  one tablet twice daily with food, for 7 days. You may use an oral decongestant such as Mucinex D or if you have glaucoma or high blood pressure use plain Mucinex. Saline nasal spray help and can safely be used as often as needed for congestion.  If you develop worsening sinus pain, fever or notice severe headache and vision changes, or if symptoms are not better after completion of antibiotic, please schedule an appointment with a health care provider.    Sinus infections are not as easily transmitted as other respiratory infection, however we still recommend that you avoid close contact with loved ones, especially the very young and elderly.  Remember to wash your hands thoroughly throughout the day as this is the number one way to prevent the spread of infection!  Home Care:  Only take medications as instructed by your medical team.  Complete the entire course of an antibiotic.  Do not take these medications with alcohol.  A steam or ultrasonic humidifier can help congestion.  You can place a towel over your head and breathe in the steam from hot water coming from a faucet.  Avoid close contacts especially the very young and the elderly.  Cover your mouth when you cough or sneeze.  Always remember to wash your hands.  Get Help Right Away If:  You develop worsening fever or sinus pain.  You develop a severe head ache or visual changes.  Your symptoms persist after you have completed your treatment plan.  Make sure you  Understand these  instructions.  Will watch your condition.  Will get help right away if you are not doing well or get worse.  You also report ear pain, redness. It looks like from the picture you may have some drainage as well- although it is hard to tell exactly what that picture is of.  To treat a possible external ear infection I have prescribed: Ciprofloxin 0.2% and dexamethasone otic suspension 3 drops in affected ears twice daily for 7 days. In certain cases swimmer's ear may progress to a more serious bacterial infection of the middle or inner ear.  If you have a fever 102 and up and significantly worsening symptoms, this could indicate a more serious infection moving to the middle/inner and needs face to face evaluation in an office by a provider. Your symptoms should improve over the next 3 days and should resolve in about 7 days.  HOME CARE:   Wash your hands frequently.  Do not place the tip of the bottle on your ear or touch it with your fingers.  You can take Acetominophen 650 mg every 4-6 hours as needed for pain.  If pain is severe or moderate, you can apply a heating pad (set on low) or hot water bottle (wrapped in a towel) to outer ear for 20 minutes.  This will also increase drainage.  Avoid ear plugs  Do not use Q-tips  After showers, help the water run out by tilting your head to one side.  GET  HELP RIGHT AWAY IF:   Fever is over 102.2 degrees.  You develop progressive ear pain or hearing loss.  Ear symptoms persist longer than 3 days after treatment.  MAKE SURE YOU:   Understand these instructions.  Will watch your condition.  Will get help right away if you are not doing well or get worse.  TO PREVENT SWIMMER'S EAR:  Use a bathing cap or custom fitted swim molds to keep your ears dry.  Towel off after swimming to dry your ears.  Tilt your head or pull your earlobes to allow the water to escape your ear canal.  If there is still water in your ears, consider using  a hairdryer on the lowest setting.  Your e-visit answers were reviewed by a board certified advanced clinical practitioner to complete your personal care plan.  Depending on the condition, your plan could have included both over the counter or prescription medications.  If there is a problem please reply  once you have received a response from your provider.  Your safety is important to [place].  If you have drug allergies check your prescription carefully.    You can use MyChart to ask questions about today's visit, request a non-urgent call back, or ask for a work or school excuse for 24 hours related to this e-Visit. If it has been greater than 24 hours you will need to follow up with your provider, or enter a new e-Visit to address those concerns.  You will get an e-mail in the next two days asking about your experience.  I hope that your e-visit has been valuable and will speed your recovery. Thank you for using e-visits.

## 2020-05-22 MED FILL — FLUoxetine HCL 20 MG CAPS: 20 | 30 days supply | Qty: 30 | Fill #0

## 2020-06-19 ENCOUNTER — Other Ambulatory Visit (HOSPITAL_COMMUNITY): Payer: Self-pay | Admitting: Family Medicine

## 2020-06-19 MED FILL — FLUoxetine HCL 20 MG CAPS: 20 | 30 days supply | Qty: 30 | Fill #0

## 2020-07-04 DIAGNOSIS — Z6841 Body Mass Index (BMI) 40.0 and over, adult: Secondary | ICD-10-CM | POA: Diagnosis not present

## 2020-07-04 DIAGNOSIS — Z1211 Encounter for screening for malignant neoplasm of colon: Secondary | ICD-10-CM | POA: Diagnosis not present

## 2020-07-04 DIAGNOSIS — N943 Premenstrual tension syndrome: Secondary | ICD-10-CM | POA: Diagnosis not present

## 2020-07-04 DIAGNOSIS — R635 Abnormal weight gain: Secondary | ICD-10-CM | POA: Diagnosis not present

## 2020-07-04 DIAGNOSIS — Z0000 Encounter for general adult medical examination without abnormal findings: Secondary | ICD-10-CM | POA: Diagnosis not present

## 2020-07-04 DIAGNOSIS — Z13220 Encounter for screening for lipoid disorders: Secondary | ICD-10-CM | POA: Diagnosis not present

## 2020-07-17 ENCOUNTER — Other Ambulatory Visit (HOSPITAL_COMMUNITY): Payer: Self-pay | Admitting: Family Medicine

## 2020-07-18 ENCOUNTER — Other Ambulatory Visit (HOSPITAL_COMMUNITY): Payer: Self-pay | Admitting: Family Medicine

## 2020-07-28 ENCOUNTER — Other Ambulatory Visit (HOSPITAL_BASED_OUTPATIENT_CLINIC_OR_DEPARTMENT_OTHER): Payer: Self-pay

## 2020-08-08 ENCOUNTER — Other Ambulatory Visit: Payer: Self-pay | Admitting: Obstetrics and Gynecology

## 2020-08-08 DIAGNOSIS — Z1231 Encounter for screening mammogram for malignant neoplasm of breast: Secondary | ICD-10-CM

## 2020-09-27 ENCOUNTER — Ambulatory Visit
Admission: RE | Admit: 2020-09-27 | Discharge: 2020-09-27 | Disposition: A | Discharge status: Home / Self Care | Payer: 59 | Source: Ambulatory Visit

## 2020-09-27 ENCOUNTER — Other Ambulatory Visit: Payer: Self-pay

## 2020-09-27 DIAGNOSIS — Z1231 Encounter for screening mammogram for malignant neoplasm of breast: Secondary | ICD-10-CM

## 2020-10-12 ENCOUNTER — Other Ambulatory Visit (HOSPITAL_COMMUNITY): Payer: Self-pay

## 2020-10-12 MED FILL — Fluoxetine HCl Cap 20 MG: ORAL | 30 days supply | Qty: 30 | Fill #0 | Status: AC

## 2020-10-12 MED FILL — Norethindrone Ace & Ethinyl Estradiol Tab 1 MG-20 MCG: ORAL | 84 days supply | Qty: 84 | Fill #0 | Status: AC

## 2020-10-13 ENCOUNTER — Other Ambulatory Visit (HOSPITAL_COMMUNITY): Payer: Self-pay

## 2020-10-16 ENCOUNTER — Other Ambulatory Visit (HOSPITAL_COMMUNITY): Payer: Self-pay

## 2020-11-24 ENCOUNTER — Other Ambulatory Visit (HOSPITAL_COMMUNITY): Payer: Self-pay

## 2020-11-24 MED FILL — Fluoxetine HCl Cap 20 MG: ORAL | 30 days supply | Qty: 30 | Fill #1 | Status: AC

## 2020-12-04 DIAGNOSIS — Z1212 Encounter for screening for malignant neoplasm of rectum: Secondary | ICD-10-CM | POA: Diagnosis not present

## 2020-12-04 DIAGNOSIS — Z1211 Encounter for screening for malignant neoplasm of colon: Secondary | ICD-10-CM | POA: Diagnosis not present

## 2020-12-21 MED FILL — Fluoxetine HCl Cap 20 MG: ORAL | 30 days supply | Qty: 30 | Fill #2 | Status: AC

## 2020-12-22 ENCOUNTER — Other Ambulatory Visit (HOSPITAL_COMMUNITY): Payer: Self-pay

## 2021-01-11 ENCOUNTER — Other Ambulatory Visit (HOSPITAL_COMMUNITY): Payer: Self-pay

## 2021-01-11 MED FILL — Fluoxetine HCl Cap 20 MG: ORAL | 90 days supply | Qty: 90 | Fill #0 | Status: CN

## 2021-01-19 ENCOUNTER — Other Ambulatory Visit (HOSPITAL_COMMUNITY): Payer: Self-pay

## 2021-01-19 MED FILL — Fluoxetine HCl Cap 20 MG: ORAL | 30 days supply | Qty: 30 | Fill #3 | Status: CN

## 2021-01-19 MED FILL — Fluoxetine HCl Cap 20 MG: ORAL | 90 days supply | Qty: 90 | Fill #0 | Status: AC

## 2021-02-14 ENCOUNTER — Other Ambulatory Visit (HOSPITAL_COMMUNITY): Payer: Self-pay

## 2021-02-14 MED FILL — Norethindrone Ace & Ethinyl Estradiol Tab 1 MG-20 MCG: ORAL | 84 days supply | Qty: 84 | Fill #1 | Status: AC

## 2021-03-07 ENCOUNTER — Other Ambulatory Visit (HOSPITAL_COMMUNITY): Payer: Self-pay

## 2021-03-07 DIAGNOSIS — Z3040 Encounter for surveillance of contraceptives, unspecified: Secondary | ICD-10-CM | POA: Diagnosis not present

## 2021-03-07 DIAGNOSIS — Z6841 Body Mass Index (BMI) 40.0 and over, adult: Secondary | ICD-10-CM | POA: Diagnosis not present

## 2021-03-07 DIAGNOSIS — Z01419 Encounter for gynecological examination (general) (routine) without abnormal findings: Secondary | ICD-10-CM | POA: Diagnosis not present

## 2021-03-07 MED ORDER — NORETHINDRONE ACET-ETHINYL EST 1-20 MG-MCG PO TABS
1.0000 | ORAL_TABLET | Freq: Every day | ORAL | 4 refills | Status: DC
  Filled 2021-03-07 – 2021-04-27 (×2): qty 84, 84d supply, fill #0
  Filled 2021-08-28: qty 84, 84d supply, fill #1
  Filled 2021-11-15: qty 84, 84d supply, fill #2

## 2021-04-13 ENCOUNTER — Other Ambulatory Visit (HOSPITAL_COMMUNITY): Payer: Self-pay

## 2021-04-13 MED FILL — Fluoxetine HCl Cap 20 MG: ORAL | 90 days supply | Qty: 90 | Fill #1 | Status: AC

## 2021-04-27 ENCOUNTER — Other Ambulatory Visit (HOSPITAL_COMMUNITY): Payer: Self-pay

## 2021-05-21 ENCOUNTER — Telehealth: Payer: 59 | Admitting: Nurse Practitioner

## 2021-05-21 DIAGNOSIS — J069 Acute upper respiratory infection, unspecified: Secondary | ICD-10-CM

## 2021-05-21 MED ORDER — FLUTICASONE PROPIONATE 50 MCG/ACT NA SUSP
2.0000 | Freq: Every day | NASAL | 0 refills | Status: AC
  Filled 2021-05-21: qty 16, 30d supply, fill #0

## 2021-05-21 NOTE — Progress Notes (Signed)
E-Visit for Upper Respiratory Infection   We are sorry you are not feeling well.  Here is how we plan to help!  Based on what you have shared with me, it looks like you may have a viral upper respiratory infection.  Upper respiratory infections are caused by a large number of viruses; however, rhinovirus is the most common cause.   Providers prescribe antibiotics to treat infections caused by bacteria. Antibiotics are very powerful in treating bacterial infections when they are used properly. To maintain their effectiveness, they should be used only when necessary. Overuse of antibiotics has resulted in the development of superbugs that are resistant to treatment!    After careful review of your answers, I would not recommend an antibiotic for your condition.  Antibiotics are not effective against viruses and therefore should not be used to treat them. Common examples of infections caused by viruses include colds, flu and COVID-19  We typically do not prescribe antibiotics prior to 7+ days of symptoms. The early stages of viral illnesses are best treated with over the counter medications.    Symptoms vary from person to person, with common symptoms including sore throat, cough, fatigue or lack of energy and feeling of general discomfort.  A low-grade fever of up to 100.4 may present, but is often uncommon.  Symptoms vary however, and are closely related to a person's age or underlying illnesses.  The most common symptoms associated with an upper respiratory infection are nasal discharge or congestion, cough, sneezing, headache and pressure in the ears and face.  These symptoms usually persist for about 3 to 10 days, but can last up to 2 weeks.  It is important to know that upper respiratory infections do not cause serious illness or complications in most cases.    Upper respiratory infections can be transmitted from person to person, with the most common method of transmission being a person's hands.   The virus is able to live on the skin and can infect other persons for up to 2 hours after direct contact.  Also, these can be transmitted when someone coughs or sneezes; thus, it is important to cover the mouth to reduce this risk.  To keep the spread of the illness at [place], good hand hygiene is very important.  This is an infection that is most likely caused by a virus. There are no specific treatments other than to help you with the symptoms until the infection runs its course.  We are sorry you are not feeling well.  Here is how we plan to help!   For nasal congestion, you may use an oral decongestants such as Mucinex D or if you have glaucoma or high blood pressure use plain Mucinex.  Saline nasal spray or nasal drops can help and can safely be used as often as needed for congestion.  For your congestion, I have prescribed Fluticasone nasal spray one spray in each nostril twice a day  If you do not have a history of heart disease, hypertension, diabetes or thyroid disease, prostate/bladder issues or glaucoma, you may also use Sudafed to treat nasal congestion.  It is highly recommended that you consult with a pharmacist or your primary care physician to ensure this medication is safe for you to take.     If you have a cough, you may use cough suppressants such as Delsym and Robitussin.  If you have glaucoma or high blood pressure, you can also use Coricidin HBP.     If you  have a sore or scratchy throat, use a saltwater gargle-  to  teaspoon of salt dissolved in a 4-ounce to 8-ounce glass of warm water.  Gargle the solution for approximately 15-30 seconds and then spit.  It is important not to swallow the solution.  You can also use throat lozenges/cough drops and Chloraseptic spray to help with throat pain or discomfort.  Warm or cold liquids can also be helpful in relieving throat pain.  For headache, pain or general discomfort, you can use Ibuprofen or Tylenol as directed.   Some authorities  believe that zinc sprays or the use of Echinacea may shorten the course of your symptoms.   HOME CARE Only take medications as instructed by your medical team. Be sure to drink plenty of fluids. Water is fine as well as fruit juices, sodas and electrolyte beverages. You may want to stay away from caffeine or alcohol. If you are nauseated, try taking small sips of liquids. How do you know if you are getting enough fluid? Your urine should be a pale yellow or almost colorless. Get rest. Taking a steamy shower or using a humidifier may help nasal congestion and ease sore throat pain. You can place a towel over your head and breathe in the steam from hot water coming from a faucet. Using a saline nasal spray works much the same way. Cough drops, hard candies and sore throat lozenges may ease your cough. Avoid close contacts especially the very young and the elderly Cover your mouth if you cough or sneeze Always remember to wash your hands.   GET HELP RIGHT AWAY IF: You develop worsening fever. If your symptoms do not improve within 10 days You develop yellow or green discharge from your nose over 3 days. You have coughing fits You develop a severe head ache or visual changes. You develop shortness of breath, difficulty breathing or start having chest pain Your symptoms persist after you have completed your treatment plan  MAKE SURE YOU  Understand these instructions. Will watch your condition. Will get help right away if you are not doing well or get worse.  Thank you for choosing an e-visit.  Your e-visit answers were reviewed by a board certified advanced clinical practitioner to complete your personal care plan. Depending upon the condition, your plan could have included both over the counter or prescription medications.  Please review your pharmacy choice. Make sure the pharmacy is open so you can pick up prescription now. If there is a problem, you may contact your provider through  [employer] and have the prescription routed to another pharmacy.  Your safety is important to [place]. If you have drug allergies check your prescription carefully.   For the next 24 hours you can use MyChart to ask questions about today's visit, request a non-urgent call back, or ask for a work or school excuse. You will get an email in the next two days asking about your experience. I hope that your e-visit has been valuable and will speed your recovery.  I spent approximately 5 minutes reviewing the patient's history, current symptoms and coordinating their plan of care today.    Meds ordered this encounter  Medications   fluticasone (FLONASE) 50 MCG/ACT nasal spray    Sig: Place 2 sprays into both nostrils daily.    Dispense:  16 g    Refill:  0

## 2021-05-22 ENCOUNTER — Other Ambulatory Visit (HOSPITAL_COMMUNITY): Payer: Self-pay

## 2021-05-29 DIAGNOSIS — Z13820 Encounter for screening for osteoporosis: Secondary | ICD-10-CM | POA: Diagnosis not present

## 2021-07-03 ENCOUNTER — Other Ambulatory Visit (HOSPITAL_COMMUNITY): Payer: Self-pay

## 2021-07-03 MED FILL — Fluoxetine HCl Cap 20 MG: ORAL | 90 days supply | Qty: 90 | Fill #2 | Status: AC

## 2021-08-15 ENCOUNTER — Other Ambulatory Visit: Payer: Self-pay | Admitting: Obstetrics and Gynecology

## 2021-08-15 DIAGNOSIS — Z1231 Encounter for screening mammogram for malignant neoplasm of breast: Secondary | ICD-10-CM

## 2021-08-28 ENCOUNTER — Other Ambulatory Visit (HOSPITAL_COMMUNITY): Payer: Self-pay

## 2021-08-28 MED ORDER — FLUOXETINE HCL 20 MG PO CAPS
ORAL_CAPSULE | ORAL | 0 refills | Status: DC
  Filled 2021-08-28 – 2021-09-18 (×3): qty 30, 30d supply, fill #0

## 2021-09-06 ENCOUNTER — Other Ambulatory Visit (HOSPITAL_COMMUNITY): Payer: Self-pay

## 2021-09-07 ENCOUNTER — Telehealth: Payer: 59 | Admitting: Family

## 2021-09-07 DIAGNOSIS — J069 Acute upper respiratory infection, unspecified: Secondary | ICD-10-CM | POA: Diagnosis not present

## 2021-09-07 MED ORDER — CETIRIZINE HCL 10 MG PO TABS: 10.0000 mg | ORAL_TABLET | Freq: Every day | ORAL | 11 refills | Status: DC

## 2021-09-07 NOTE — Progress Notes (Signed)
E-Visit for Upper Respiratory Infection  ? ?We are sorry you are not feeling well.  Here is how we plan to help! ? ?Based on what you have shared with me, it looks like you may have a viral upper respiratory infection.  Upper respiratory infections are caused by a large number of viruses; however, rhinovirus is the most common cause.  ? ?Symptoms vary from person to person, with common symptoms including sore throat, cough, fatigue or lack of energy and feeling of general discomfort.  A low-grade fever of up to 100.4 may present, but is often uncommon.  Symptoms vary however, and are closely related to a person's age or underlying illnesses.  The most common symptoms associated with an upper respiratory infection are nasal discharge or congestion, cough, sneezing, headache and pressure in the ears and face.  These symptoms usually persist for about 3 to 10 days, but can last up to 2 weeks.  It is important to know that upper respiratory infections do not cause serious illness or complications in most cases.   ? ?Upper respiratory infections can be transmitted from person to person, with the most common method of transmission being a person's hands.  The virus is able to live on the skin and can infect other persons for up to 2 hours after direct contact.  Also, these can be transmitted when someone coughs or sneezes; thus, it is important to cover the mouth to reduce this risk.  To keep the spread of the illness at [place], good hand hygiene is very important. ? ?This is an infection that is most likely caused by a virus. There are no specific treatments other than to help you with the symptoms until the infection runs its course.  We are sorry you are not feeling well.  Here is how we plan to help! ? ? ?For nasal congestion, you may use an oral decongestants such as Mucinex D or if you have glaucoma or high blood pressure use plain Mucinex.  Saline nasal spray or nasal drops can help and can safely be used as often as  needed for congestion.  For your congestion, continue your Fluticasone nasal spray one spray in each nostril twice a day. I recommend you start Zyrtec 10 mg also.  ? ?If you do not have a history of heart disease, hypertension, diabetes or thyroid disease, prostate/bladder issues or glaucoma, you may also use Sudafed to treat nasal congestion.  It is highly recommended that you consult with a pharmacist or your primary care physician to ensure this medication is safe for you to take.    ? ?If you have a cough, you may use cough suppressants such as Delsym and Robitussin.  If you have glaucoma or high blood pressure, you can also use Coricidin HBP.   ? ? ?If you have a sore or scratchy throat, use a saltwater gargle- ? to ? teaspoon of salt dissolved in a 4-ounce to 8-ounce glass of warm water.  Gargle the solution for approximately 15-30 seconds and then spit.  It is important not to swallow the solution.  You can also use throat lozenges/cough drops and Chloraseptic spray to help with throat pain or discomfort.  Warm or cold liquids can also be helpful in relieving throat pain. ? ?For headache, pain or general discomfort, you can use Ibuprofen or Tylenol as directed.   ?Some authorities believe that zinc sprays or the use of Echinacea may shorten the course of your symptoms. ? ? ?HOME CARE ?  Only take medications as instructed by your medical team. ?Be sure to drink plenty of fluids. Water is fine as well as fruit juices, sodas and electrolyte beverages. You may want to stay away from caffeine or alcohol. If you are nauseated, try taking small sips of liquids. How do you know if you are getting enough fluid? Your urine should be a pale yellow or almost colorless. ?Get rest. ?Taking a steamy shower or using a humidifier may help nasal congestion and ease sore throat pain. You can place a towel over your head and breathe in the steam from hot water coming from a faucet. ?Using a saline nasal spray works much the same  way. ?Cough drops, hard candies and sore throat lozenges may ease your cough. ?Avoid close contacts especially the very young and the elderly ?Cover your mouth if you cough or sneeze ?Always remember to wash your hands.  ? ?GET HELP RIGHT AWAY IF: ?You develop worsening fever. ?If your symptoms do not improve within 10 days ?You develop yellow or green discharge from your nose over 3 days. ?You have coughing fits ?You develop a severe head ache or visual changes. ?You develop shortness of breath, difficulty breathing or start having chest pain ?Your symptoms persist after you have completed your treatment plan ? ?MAKE SURE YOU  ?Understand these instructions. ?Will watch your condition. ?Will get help right away if you are not doing well or get worse. ? ?Thank you for choosing an e-visit. ? ?Your e-visit answers were reviewed by a board certified advanced clinical practitioner to complete your personal care plan. Depending upon the condition, your plan could have included both over the counter or prescription medications. ? ?Please review your pharmacy choice. Make sure the pharmacy is open so you can pick up prescription now. If there is a problem, you may contact your provider through [employer] and have the prescription routed to another pharmacy.  Your safety is important to [place]. If you have drug allergies check your prescription carefully.  ? ?For the next 24 hours you can use MyChart to ask questions about today's visit, request a non-urgent call back, or ask for a work or school excuse. ?You will get an email in the next two days asking about your experience. I hope that your e-visit has been valuable and will speed your recovery. ? ?Approximately 5 minutes was spent documenting and reviewing patient's chart.  ? ? ? ? ?

## 2021-09-12 ENCOUNTER — Other Ambulatory Visit (HOSPITAL_COMMUNITY): Payer: Self-pay

## 2021-09-18 ENCOUNTER — Other Ambulatory Visit (HOSPITAL_COMMUNITY): Payer: Self-pay

## 2021-09-28 ENCOUNTER — Ambulatory Visit
Admission: RE | Admit: 2021-09-28 | Discharge: 2021-09-28 | Disposition: A | Discharge status: Home / Self Care | Payer: 59 | Source: Ambulatory Visit

## 2021-09-28 DIAGNOSIS — Z1231 Encounter for screening mammogram for malignant neoplasm of breast: Secondary | ICD-10-CM

## 2021-10-05 DIAGNOSIS — Z0000 Encounter for general adult medical examination without abnormal findings: Secondary | ICD-10-CM | POA: Diagnosis not present

## 2021-10-05 DIAGNOSIS — Z6841 Body Mass Index (BMI) 40.0 and over, adult: Secondary | ICD-10-CM | POA: Diagnosis not present

## 2021-10-05 DIAGNOSIS — N943 Premenstrual tension syndrome: Secondary | ICD-10-CM | POA: Diagnosis not present

## 2021-10-05 DIAGNOSIS — R748 Abnormal levels of other serum enzymes: Secondary | ICD-10-CM | POA: Diagnosis not present

## 2021-10-05 DIAGNOSIS — Z13220 Encounter for screening for lipoid disorders: Secondary | ICD-10-CM | POA: Diagnosis not present

## 2021-10-05 DIAGNOSIS — E559 Vitamin D deficiency, unspecified: Secondary | ICD-10-CM | POA: Diagnosis not present

## 2021-11-08 ENCOUNTER — Other Ambulatory Visit (HOSPITAL_COMMUNITY): Payer: Self-pay

## 2021-11-08 MED ORDER — FLUOXETINE HCL 20 MG PO CAPS
ORAL_CAPSULE | ORAL | 3 refills | Status: DC
  Filled 2021-11-08: qty 90, 90d supply, fill #0
  Filled 2022-02-01: qty 90, 90d supply, fill #1
  Filled 2022-02-01: qty 37, 37d supply, fill #1
  Filled 2022-04-25: qty 90, 90d supply, fill #2
  Filled 2022-07-31: qty 90, 90d supply, fill #3

## 2021-11-15 ENCOUNTER — Other Ambulatory Visit (HOSPITAL_COMMUNITY): Payer: Self-pay

## 2022-02-01 ENCOUNTER — Other Ambulatory Visit (HOSPITAL_COMMUNITY): Payer: Self-pay

## 2022-03-21 ENCOUNTER — Other Ambulatory Visit (HOSPITAL_COMMUNITY): Payer: Self-pay

## 2022-03-21 DIAGNOSIS — Z124 Encounter for screening for malignant neoplasm of cervix: Secondary | ICD-10-CM | POA: Diagnosis not present

## 2022-03-21 DIAGNOSIS — Z01419 Encounter for gynecological examination (general) (routine) without abnormal findings: Secondary | ICD-10-CM | POA: Diagnosis not present

## 2022-03-21 DIAGNOSIS — Z6841 Body Mass Index (BMI) 40.0 and over, adult: Secondary | ICD-10-CM | POA: Diagnosis not present

## 2022-03-21 DIAGNOSIS — Z3040 Encounter for surveillance of contraceptives, unspecified: Secondary | ICD-10-CM | POA: Diagnosis not present

## 2022-03-21 MED ORDER — NORETHINDRONE ACET-ETHINYL EST 1-20 MG-MCG PO TABS
1.0000 | ORAL_TABLET | Freq: Every day | ORAL | 4 refills | Status: DC
  Filled 2022-03-21: qty 63, 63d supply, fill #0
  Filled 2022-06-05: qty 63, 63d supply, fill #1
  Filled 2022-08-27: qty 63, 63d supply, fill #2

## 2022-04-25 ENCOUNTER — Other Ambulatory Visit (HOSPITAL_COMMUNITY): Payer: Self-pay

## 2022-06-05 ENCOUNTER — Other Ambulatory Visit (HOSPITAL_COMMUNITY): Payer: Self-pay

## 2022-06-05 ENCOUNTER — Other Ambulatory Visit: Payer: Self-pay

## 2022-07-31 ENCOUNTER — Other Ambulatory Visit: Payer: Self-pay

## 2022-07-31 ENCOUNTER — Other Ambulatory Visit (HOSPITAL_COMMUNITY): Payer: Self-pay

## 2022-08-28 ENCOUNTER — Other Ambulatory Visit (HOSPITAL_COMMUNITY): Payer: Self-pay

## 2022-08-30 ENCOUNTER — Other Ambulatory Visit: Payer: Self-pay | Admitting: Obstetrics and Gynecology

## 2022-08-30 DIAGNOSIS — Z1231 Encounter for screening mammogram for malignant neoplasm of breast: Secondary | ICD-10-CM

## 2022-09-04 ENCOUNTER — Other Ambulatory Visit (HOSPITAL_COMMUNITY): Payer: Self-pay

## 2022-10-01 ENCOUNTER — Ambulatory Visit
Admission: RE | Admit: 2022-10-01 | Discharge: 2022-10-01 | Disposition: A | Discharge status: Home / Self Care | Payer: 59 | Source: Ambulatory Visit

## 2022-10-01 DIAGNOSIS — Z1231 Encounter for screening mammogram for malignant neoplasm of breast: Secondary | ICD-10-CM | POA: Diagnosis not present

## 2022-10-03 ENCOUNTER — Other Ambulatory Visit: Payer: Self-pay | Admitting: Obstetrics and Gynecology

## 2022-10-03 DIAGNOSIS — R928 Other abnormal and inconclusive findings on diagnostic imaging of breast: Secondary | ICD-10-CM

## 2022-10-07 ENCOUNTER — Other Ambulatory Visit: Payer: Self-pay | Admitting: Obstetrics and Gynecology

## 2022-10-07 ENCOUNTER — Ambulatory Visit
Admission: RE | Admit: 2022-10-07 | Discharge: 2022-10-07 | Disposition: A | Discharge status: Home / Self Care | Payer: 59 | Source: Ambulatory Visit | Attending: Obstetrics and Gynecology | Admitting: Obstetrics and Gynecology

## 2022-10-07 DIAGNOSIS — R928 Other abnormal and inconclusive findings on diagnostic imaging of breast: Secondary | ICD-10-CM

## 2022-10-07 DIAGNOSIS — R922 Inconclusive mammogram: Secondary | ICD-10-CM | POA: Diagnosis not present

## 2022-10-07 DIAGNOSIS — N63 Unspecified lump in unspecified breast: Secondary | ICD-10-CM | POA: Diagnosis not present

## 2022-10-07 DIAGNOSIS — N6310 Unspecified lump in the right breast, unspecified quadrant: Secondary | ICD-10-CM

## 2022-10-09 ENCOUNTER — Ambulatory Visit
Admission: RE | Admit: 2022-10-09 | Discharge: 2022-10-09 | Disposition: A | Discharge status: Home / Self Care | Payer: 59 | Source: Ambulatory Visit | Attending: Obstetrics and Gynecology | Admitting: Obstetrics and Gynecology

## 2022-10-09 DIAGNOSIS — N6310 Unspecified lump in the right breast, unspecified quadrant: Secondary | ICD-10-CM

## 2022-10-09 DIAGNOSIS — N6081 Other benign mammary dysplasias of right breast: Secondary | ICD-10-CM | POA: Diagnosis not present

## 2022-10-09 DIAGNOSIS — R928 Other abnormal and inconclusive findings on diagnostic imaging of breast: Secondary | ICD-10-CM

## 2022-10-09 DIAGNOSIS — D0511 Intraductal carcinoma in situ of right breast: Secondary | ICD-10-CM | POA: Diagnosis not present

## 2022-10-09 HISTORY — PX: BREAST BIOPSY: SHX20

## 2022-10-24 ENCOUNTER — Other Ambulatory Visit (HOSPITAL_COMMUNITY): Payer: Self-pay

## 2022-10-24 ENCOUNTER — Other Ambulatory Visit: Payer: Self-pay

## 2022-10-24 MED ORDER — FLUOXETINE HCL 20 MG PO CAPS
20.0000 mg | ORAL_CAPSULE | Freq: Every day | ORAL | 0 refills | Status: DC
  Filled 2022-10-24: qty 90, 90d supply, fill #0

## 2022-11-27 DIAGNOSIS — N943 Premenstrual tension syndrome: Secondary | ICD-10-CM | POA: Diagnosis not present

## 2022-11-27 DIAGNOSIS — R748 Abnormal levels of other serum enzymes: Secondary | ICD-10-CM | POA: Diagnosis not present

## 2022-11-27 DIAGNOSIS — Z13220 Encounter for screening for lipoid disorders: Secondary | ICD-10-CM | POA: Diagnosis not present

## 2022-11-27 DIAGNOSIS — Z0000 Encounter for general adult medical examination without abnormal findings: Secondary | ICD-10-CM | POA: Diagnosis not present

## 2022-11-27 DIAGNOSIS — Z6841 Body Mass Index (BMI) 40.0 and over, adult: Secondary | ICD-10-CM | POA: Diagnosis not present

## 2022-11-27 DIAGNOSIS — E559 Vitamin D deficiency, unspecified: Secondary | ICD-10-CM | POA: Diagnosis not present

## 2022-11-27 DIAGNOSIS — N912 Amenorrhea, unspecified: Secondary | ICD-10-CM | POA: Diagnosis not present

## 2022-11-27 DIAGNOSIS — Z79899 Other long term (current) drug therapy: Secondary | ICD-10-CM | POA: Diagnosis not present

## 2022-12-02 DIAGNOSIS — D241 Benign neoplasm of right breast: Secondary | ICD-10-CM | POA: Diagnosis not present

## 2023-01-16 ENCOUNTER — Other Ambulatory Visit (HOSPITAL_COMMUNITY): Payer: Self-pay

## 2023-01-16 MED ORDER — FLUOXETINE HCL 20 MG PO CAPS
20.0000 mg | ORAL_CAPSULE | Freq: Every day | ORAL | 2 refills | Status: DC
  Filled 2023-01-16: qty 90, 90d supply, fill #0
  Filled 2023-05-08: qty 90, 90d supply, fill #1
  Filled 2023-08-01: qty 90, 90d supply, fill #2

## 2023-01-17 ENCOUNTER — Other Ambulatory Visit: Payer: Self-pay

## 2023-01-27 ENCOUNTER — Other Ambulatory Visit: Payer: Self-pay | Admitting: General Surgery

## 2023-01-27 DIAGNOSIS — R928 Other abnormal and inconclusive findings on diagnostic imaging of breast: Secondary | ICD-10-CM

## 2023-04-11 ENCOUNTER — Ambulatory Visit
Admission: RE | Admit: 2023-04-11 | Discharge: 2023-04-11 | Disposition: A | Discharge status: Home / Self Care | Payer: 59 | Source: Ambulatory Visit | Attending: General Surgery | Admitting: General Surgery

## 2023-04-11 DIAGNOSIS — Z6841 Body Mass Index (BMI) 40.0 and over, adult: Secondary | ICD-10-CM | POA: Diagnosis not present

## 2023-04-11 DIAGNOSIS — Z01419 Encounter for gynecological examination (general) (routine) without abnormal findings: Secondary | ICD-10-CM | POA: Diagnosis not present

## 2023-04-11 DIAGNOSIS — R928 Other abnormal and inconclusive findings on diagnostic imaging of breast: Secondary | ICD-10-CM

## 2023-04-11 DIAGNOSIS — Z124 Encounter for screening for malignant neoplasm of cervix: Secondary | ICD-10-CM | POA: Diagnosis not present

## 2023-04-11 DIAGNOSIS — D241 Benign neoplasm of right breast: Secondary | ICD-10-CM | POA: Diagnosis not present

## 2023-04-12 ENCOUNTER — Other Ambulatory Visit: Payer: Self-pay | Admitting: General Surgery

## 2023-04-12 DIAGNOSIS — N6310 Unspecified lump in the right breast, unspecified quadrant: Secondary | ICD-10-CM

## 2023-05-08 ENCOUNTER — Other Ambulatory Visit (HOSPITAL_BASED_OUTPATIENT_CLINIC_OR_DEPARTMENT_OTHER): Payer: Self-pay

## 2023-05-08 ENCOUNTER — Other Ambulatory Visit: Payer: Self-pay

## 2023-05-08 ENCOUNTER — Telehealth: Payer: 59 | Admitting: Family Medicine

## 2023-05-08 DIAGNOSIS — J069 Acute upper respiratory infection, unspecified: Secondary | ICD-10-CM | POA: Diagnosis not present

## 2023-05-08 MED ORDER — AZELASTINE HCL 0.1 % NA SOLN
2.0000 | Freq: Two times a day (BID) | NASAL | 0 refills | Status: DC
Start: 2023-05-08 — End: 2023-10-15
  Filled 2023-05-08: qty 30, 50d supply, fill #0

## 2023-05-08 NOTE — Progress Notes (Signed)

## 2023-05-09 ENCOUNTER — Other Ambulatory Visit (HOSPITAL_COMMUNITY): Payer: Self-pay

## 2023-05-09 ENCOUNTER — Other Ambulatory Visit: Payer: Self-pay

## 2023-08-01 ENCOUNTER — Other Ambulatory Visit (HOSPITAL_COMMUNITY): Payer: Self-pay

## 2023-09-03 ENCOUNTER — Other Ambulatory Visit: Payer: Self-pay | Admitting: General Surgery

## 2023-09-03 DIAGNOSIS — N6310 Unspecified lump in the right breast, unspecified quadrant: Secondary | ICD-10-CM

## 2023-10-03 ENCOUNTER — Ambulatory Visit

## 2023-10-03 ENCOUNTER — Ambulatory Visit
Admission: RE | Admit: 2023-10-03 | Discharge: 2023-10-03 | Disposition: A | Discharge status: Home / Self Care | Source: Ambulatory Visit | Attending: General Surgery | Admitting: General Surgery

## 2023-10-03 ENCOUNTER — Encounter

## 2023-10-03 DIAGNOSIS — D241 Benign neoplasm of right breast: Secondary | ICD-10-CM | POA: Diagnosis not present

## 2023-10-03 DIAGNOSIS — Z1231 Encounter for screening mammogram for malignant neoplasm of breast: Secondary | ICD-10-CM

## 2023-10-03 DIAGNOSIS — N6310 Unspecified lump in the right breast, unspecified quadrant: Secondary | ICD-10-CM

## 2023-10-15 ENCOUNTER — Other Ambulatory Visit (HOSPITAL_COMMUNITY): Payer: Self-pay

## 2023-10-15 ENCOUNTER — Telehealth: Admitting: Physician Assistant

## 2023-10-15 DIAGNOSIS — J208 Acute bronchitis due to other specified organisms: Secondary | ICD-10-CM | POA: Diagnosis not present

## 2023-10-15 MED ORDER — BENZONATATE 100 MG PO CAPS
100.0000 mg | ORAL_CAPSULE | Freq: Three times a day (TID) | ORAL | 0 refills | Status: AC | PRN
Start: 2023-10-15 — End: ?
  Filled 2023-10-15 (×2): qty 30, 5d supply, fill #0

## 2023-10-15 MED ORDER — PREDNISONE 20 MG PO TABS
40.0000 mg | ORAL_TABLET | Freq: Every day | ORAL | 0 refills | Status: DC
Start: 2023-10-15 — End: 2024-03-12
  Filled 2023-10-15 (×2): qty 10, 5d supply, fill #0

## 2023-10-15 NOTE — Progress Notes (Signed)
 E-Visit for Cough   We are sorry that you are not feeling well.  Here is how we plan to help!  Based on your presentation I believe you most likely have A cough due to a virus.  This is called viral bronchitis and is best treated by rest, plenty of fluids and control of the cough.  You may use Ibuprofen or Tylenol  as directed to help your symptoms.     In addition you may use A non-prescription cough medication called Mucinex DM: take 2 tablets every 12 hours. and A prescription cough medication called Tessalon  Perles 100mg . You may take 1-2 capsules every 8 hours as needed for your cough.  I have also prescribed: Prednisone  20mg  Take 2 tablets (40mg ) daily for 5 days  From your responses in the eVisit questionnaire you describe inflammation in the upper respiratory tract which is causing a significant cough.  This is commonly called Bronchitis and has four common causes:   Allergies Viral Infections Acid Reflux Bacterial Infection Allergies, viruses and acid reflux are treated by controlling symptoms or eliminating the cause. An example might be a cough caused by taking certain blood pressure medications. You stop the cough by changing the medication. Another example might be a cough caused by acid reflux. Controlling the reflux helps control the cough.  USE OF BRONCHODILATOR (RESCUE) INHALERS: There is a risk from using your bronchodilator too frequently.  The risk is that over-reliance on a medication which only relaxes the muscles surrounding the breathing tubes can reduce the effectiveness of medications prescribed to reduce swelling and congestion of the tubes themselves.  Although you feel brief relief from the bronchodilator inhaler, your asthma may actually be worsening with the tubes becoming more swollen and filled with mucus.  This can delay other crucial treatments, such as oral steroid medications. If you need to use a bronchodilator inhaler daily, several times per day, you should  discuss this with your provider.  There are probably better treatments that could be used to keep your asthma under control.     HOME CARE Only take medications as instructed by your medical team. Complete the entire course of an antibiotic. Drink plenty of fluids and get plenty of rest. Avoid close contacts especially the very young and the elderly Cover your mouth if you cough or cough into your sleeve. Always remember to wash your hands A steam or ultrasonic humidifier can help congestion.   GET HELP RIGHT AWAY IF: You develop worsening fever. You become short of breath You cough up blood. Your symptoms persist after you have completed your treatment plan MAKE SURE YOU  Understand these instructions. Will watch your condition. Will get help right away if you are not doing well or get worse.    Thank you for choosing an e-visit.  Your e-visit answers were reviewed by a board certified advanced clinical practitioner to complete your personal care plan. Depending upon the condition, your plan could have included both over the counter or prescription medications.  Please review your pharmacy choice. Make sure the pharmacy is open so you can pick up prescription now. If there is a problem, you may contact your provider through [employer] and have the prescription routed to another pharmacy.  Your safety is important to us . If you have drug allergies check your prescription carefully.   For the next 24 hours you can use MyChart to ask questions about today's visit, request a non-urgent call back, or ask for a work or school  excuse. You will get an email in the next two days asking about your experience. I hope that your e-visit has been valuable and will speed your recovery.   I have spent 5 minutes in review of e-visit questionnaire, review and updating patient chart, medical decision making and response to patient.   Angelia Kelp, PA-C

## 2023-10-23 ENCOUNTER — Other Ambulatory Visit: Payer: Self-pay

## 2023-10-23 ENCOUNTER — Other Ambulatory Visit (HOSPITAL_COMMUNITY): Payer: Self-pay

## 2023-10-23 MED ORDER — FLUOXETINE HCL 20 MG PO CAPS
20.0000 mg | ORAL_CAPSULE | Freq: Every day | ORAL | 0 refills | Status: AC
  Filled 2023-10-23: qty 90, 90d supply, fill #0

## 2023-12-02 ENCOUNTER — Other Ambulatory Visit (HOSPITAL_BASED_OUTPATIENT_CLINIC_OR_DEPARTMENT_OTHER): Payer: Self-pay

## 2023-12-02 DIAGNOSIS — G4700 Insomnia, unspecified: Secondary | ICD-10-CM | POA: Diagnosis not present

## 2023-12-02 DIAGNOSIS — N951 Menopausal and female climacteric states: Secondary | ICD-10-CM | POA: Diagnosis not present

## 2023-12-02 DIAGNOSIS — R6882 Decreased libido: Secondary | ICD-10-CM | POA: Diagnosis not present

## 2023-12-02 DIAGNOSIS — E669 Obesity, unspecified: Secondary | ICD-10-CM | POA: Diagnosis not present

## 2023-12-02 MED ORDER — ESTRADIOL 0.05 MG/24HR TD PTTW
1.0000 | MEDICATED_PATCH | TRANSDERMAL | 0 refills | Status: DC
  Filled 2023-12-02: qty 8, 28d supply, fill #0

## 2023-12-02 MED ORDER — PROGESTERONE MICRONIZED 100 MG PO CAPS
100.0000 mg | ORAL_CAPSULE | Freq: Every day | ORAL | 0 refills | Status: AC
  Filled 2023-12-02: qty 30, 30d supply, fill #0

## 2023-12-29 ENCOUNTER — Other Ambulatory Visit: Payer: Self-pay

## 2023-12-29 ENCOUNTER — Encounter (HOSPITAL_BASED_OUTPATIENT_CLINIC_OR_DEPARTMENT_OTHER): Payer: Self-pay

## 2023-12-29 ENCOUNTER — Other Ambulatory Visit (HOSPITAL_BASED_OUTPATIENT_CLINIC_OR_DEPARTMENT_OTHER): Payer: Self-pay

## 2023-12-29 ENCOUNTER — Other Ambulatory Visit (HOSPITAL_COMMUNITY): Payer: Self-pay

## 2023-12-29 DIAGNOSIS — G4700 Insomnia, unspecified: Secondary | ICD-10-CM | POA: Diagnosis not present

## 2023-12-29 DIAGNOSIS — N951 Menopausal and female climacteric states: Secondary | ICD-10-CM | POA: Diagnosis not present

## 2023-12-29 DIAGNOSIS — R6882 Decreased libido: Secondary | ICD-10-CM | POA: Diagnosis not present

## 2023-12-29 DIAGNOSIS — E669 Obesity, unspecified: Secondary | ICD-10-CM | POA: Diagnosis not present

## 2023-12-29 MED ORDER — PROGESTERONE 200 MG PO CAPS
200.0000 mg | ORAL_CAPSULE | Freq: Every day | ORAL | 1 refills | Status: DC
  Filled 2023-12-29 (×2): qty 30, 30d supply, fill #0
  Filled 2024-01-22: qty 30, 30d supply, fill #1

## 2023-12-29 MED ORDER — ESTRADIOL 0.05 MG/24HR TD PTTW
1.0000 | MEDICATED_PATCH | TRANSDERMAL | 1 refills | Status: DC
  Filled 2023-12-29 (×2): qty 8, 28d supply, fill #0
  Filled 2024-01-22: qty 8, 28d supply, fill #1

## 2024-01-22 ENCOUNTER — Other Ambulatory Visit (HOSPITAL_COMMUNITY): Payer: Self-pay

## 2024-01-26 ENCOUNTER — Other Ambulatory Visit (HOSPITAL_COMMUNITY): Payer: Self-pay

## 2024-01-26 DIAGNOSIS — Z0000 Encounter for general adult medical examination without abnormal findings: Secondary | ICD-10-CM | POA: Diagnosis not present

## 2024-01-26 DIAGNOSIS — N943 Premenstrual tension syndrome: Secondary | ICD-10-CM | POA: Diagnosis not present

## 2024-01-26 DIAGNOSIS — Z6841 Body Mass Index (BMI) 40.0 and over, adult: Secondary | ICD-10-CM | POA: Diagnosis not present

## 2024-01-26 DIAGNOSIS — E7800 Pure hypercholesterolemia, unspecified: Secondary | ICD-10-CM | POA: Diagnosis not present

## 2024-01-26 MED ORDER — PROGESTERONE 200 MG PO CAPS
200.0000 mg | ORAL_CAPSULE | Freq: Every day | ORAL | 1 refills | Status: AC
  Filled 2024-01-26 – 2024-02-16 (×2): qty 90, 90d supply, fill #0

## 2024-01-26 MED ORDER — ESTRADIOL 0.05 MG/24HR TD PTTW
1.0000 | MEDICATED_PATCH | TRANSDERMAL | 1 refills | Status: AC
  Filled 2024-01-26 – 2024-02-16 (×2): qty 24, 84d supply, fill #0
  Filled 2024-05-05: qty 24, 84d supply, fill #1

## 2024-02-04 ENCOUNTER — Other Ambulatory Visit (HOSPITAL_COMMUNITY): Payer: Self-pay

## 2024-02-04 DIAGNOSIS — Z1211 Encounter for screening for malignant neoplasm of colon: Secondary | ICD-10-CM | POA: Diagnosis not present

## 2024-02-04 DIAGNOSIS — Z1212 Encounter for screening for malignant neoplasm of rectum: Secondary | ICD-10-CM | POA: Diagnosis not present

## 2024-02-12 ENCOUNTER — Telehealth: Admitting: Emergency Medicine

## 2024-02-12 DIAGNOSIS — H00014 Hordeolum externum left upper eyelid: Secondary | ICD-10-CM

## 2024-02-12 NOTE — Progress Notes (Signed)
 We are sorry that you are not feeling well. Here is how we plan to help!  Based on what you have shared with me it looks like you have a stye.  A stye is an inflammation of the eyelid.  It is often a red, painful lump near the edge of the eyelid that may look like a boil or a pimple.  A stye develops when an infection occurs at the base of an eyelash.   We have made appropriate suggestions for you based upon your presentation: Simple styes can be treated without medical intervention.  Most styes either resolve spontaneously or resolve with simple home treatment by applying warm compresses or heated washcloth to the stye for about 10-15 minutes three to four times a day. This causes the stye to drain and resolve.  Most styes resolve with the above care. If your stye does not reduce in size within two weeks, I recommend you see an eye doctor. Rarely in these cases does the stye need to be drained.   HOME CARE:  Wash your hands often! Let the stye open on its own. Don't squeeze or open it. Don't rub your eyes. This can irritate your eyes and let in bacteria.  If you need to touch your eyes, wash your hands first. Don't wear eye makeup or contact lenses until the area has healed.  GET HELP RIGHT AWAY IF:  Your symptoms do not improve. You develop blurred or loss of vision. Your symptoms worsen (increased discharge, pain or redness).  Thank you for choosing an e-visit.  Your e-visit answers were reviewed by a board certified advanced clinical practitioner to complete your personal care plan.  Depending upon the condition, your plan could have included both over the counter or prescription medications.  Please review your pharmacy choice.  Make sure the pharmacy is open so you can pick up prescription now.  If there is a problem, you may contact your provider through [employer] and have the prescription routed to another pharmacy.    Your safety is important to us .  If you have drug  allergies check your prescription carefully.  For the next 24 hours you can use MyChart to ask questions about today's visit, request a non-urgent call back, or ask for a work or school excuse.  You will get an email in the next two days asking about your experience.  I hope you that your e-visit has been valuable and will speed your recovery.  I have spent 5 minutes in review of e-visit questionnaire, review and updating patient chart, medical decision making and response to patient.   Jon Belt, PhD, FNP-BC

## 2024-02-16 ENCOUNTER — Other Ambulatory Visit: Payer: Self-pay

## 2024-02-16 ENCOUNTER — Other Ambulatory Visit (HOSPITAL_COMMUNITY): Payer: Self-pay

## 2024-02-17 ENCOUNTER — Other Ambulatory Visit (HOSPITAL_COMMUNITY): Payer: Self-pay

## 2024-02-17 ENCOUNTER — Encounter: Payer: Self-pay | Admitting: Pharmacist

## 2024-02-17 ENCOUNTER — Other Ambulatory Visit: Payer: Self-pay

## 2024-02-18 ENCOUNTER — Other Ambulatory Visit: Payer: Self-pay

## 2024-03-05 IMAGING — MG MM DIGITAL SCREENING BILAT W/ TOMO AND CAD
6 of 10 series · 6 of 30 positions shown · non-contrast
Comparison: Previous exam(s).

CLINICAL DATA: Screening.

EXAM:
DIGITAL SCREENING BILATERAL MAMMOGRAM WITH TOMOSYNTHESIS AND CAD
TECHNIQUE: Bilateral screening digital craniocaudal and mediolateral oblique
mammograms were obtained. Bilateral screening digital breast
tomosynthesis was performed. The images were evaluated with
computer-aided detection.

[R CC synth-2D]
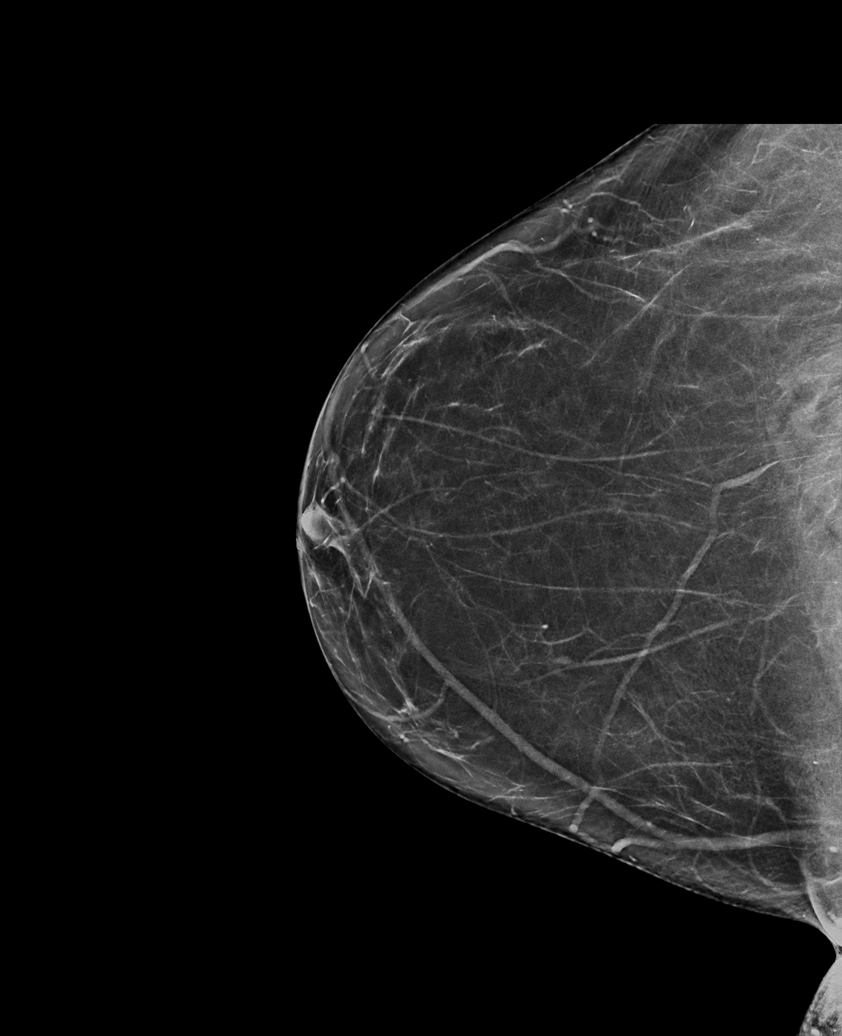

[R MLO synth-2D]
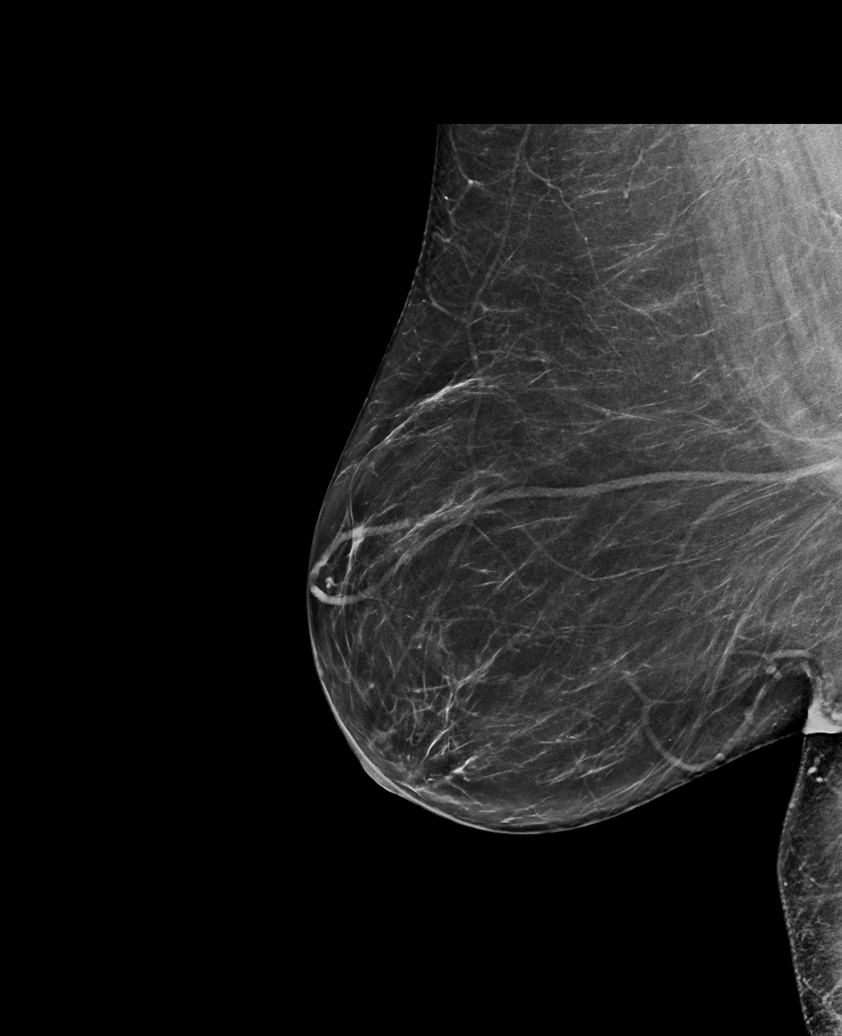

[L CC synth-2D]
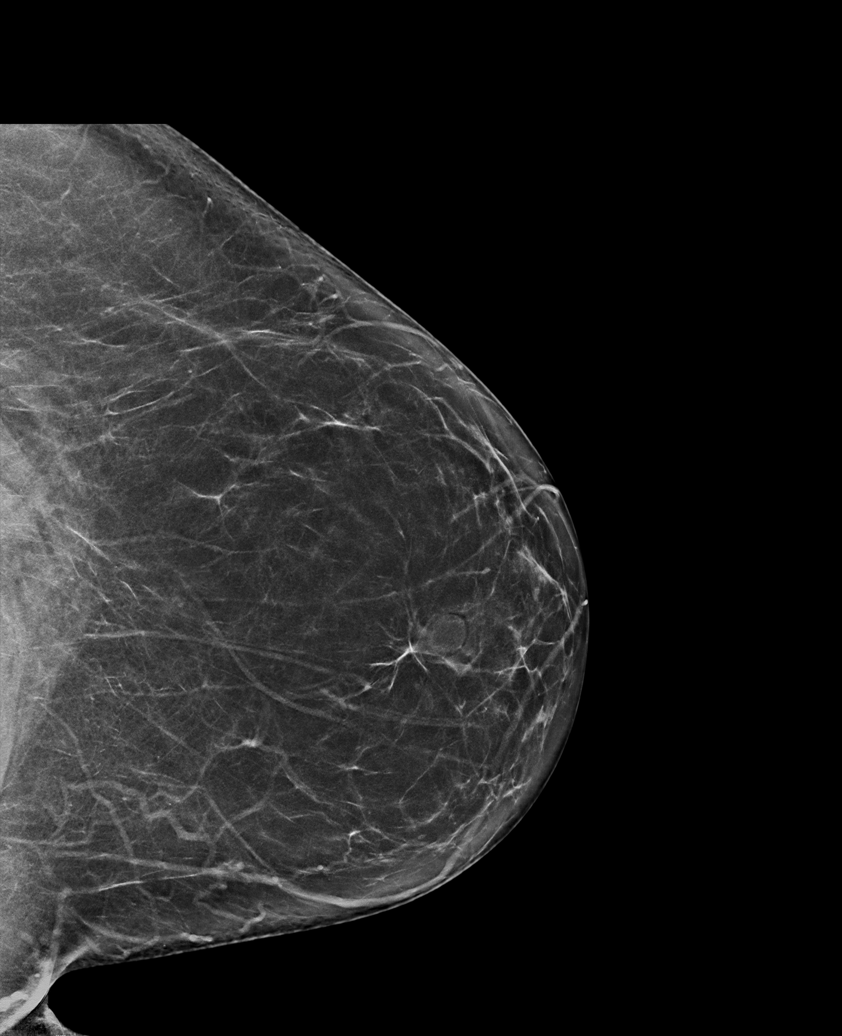

[L MLO synth-2D (1 of 2)]
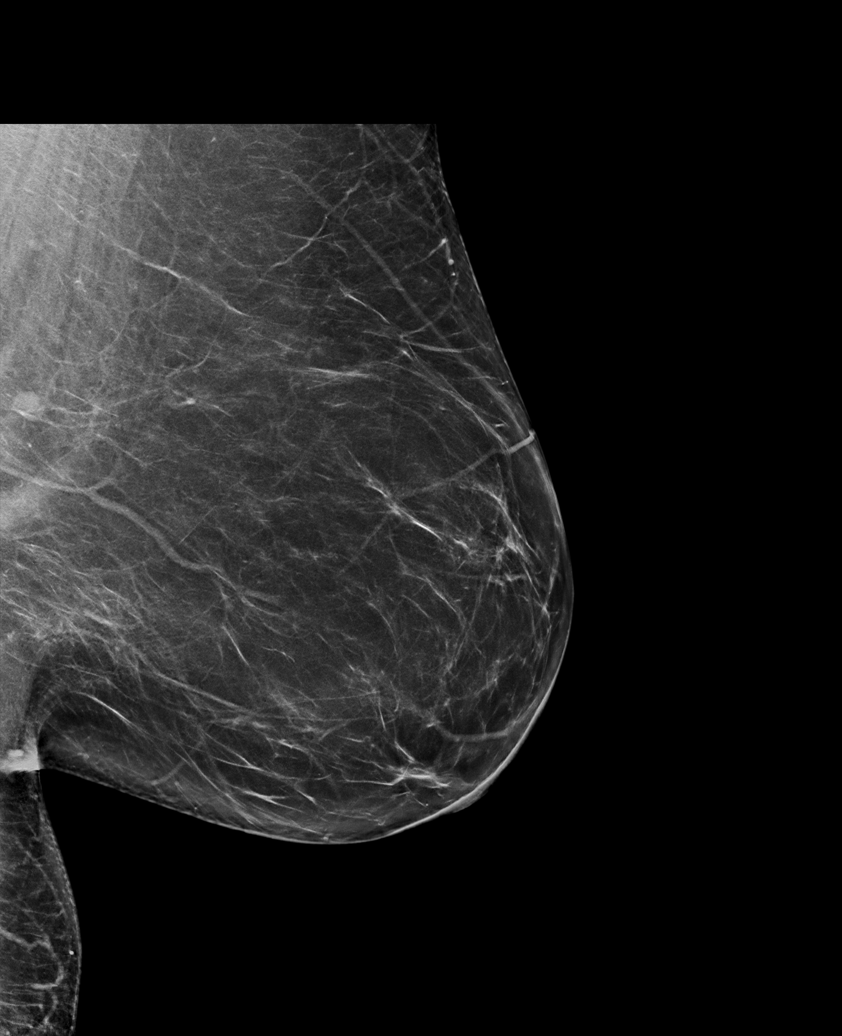

[L MLO synth-2D (2 of 2)]
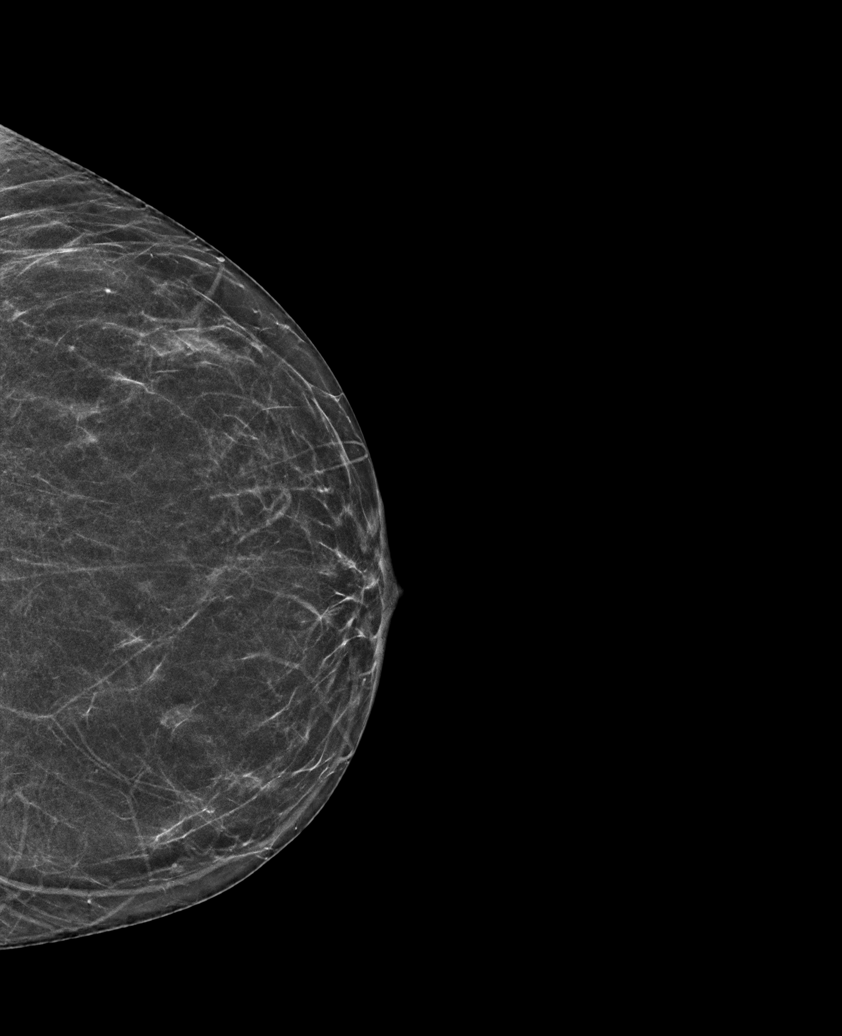

[L CC tomo · tomo slice 37/72.0]
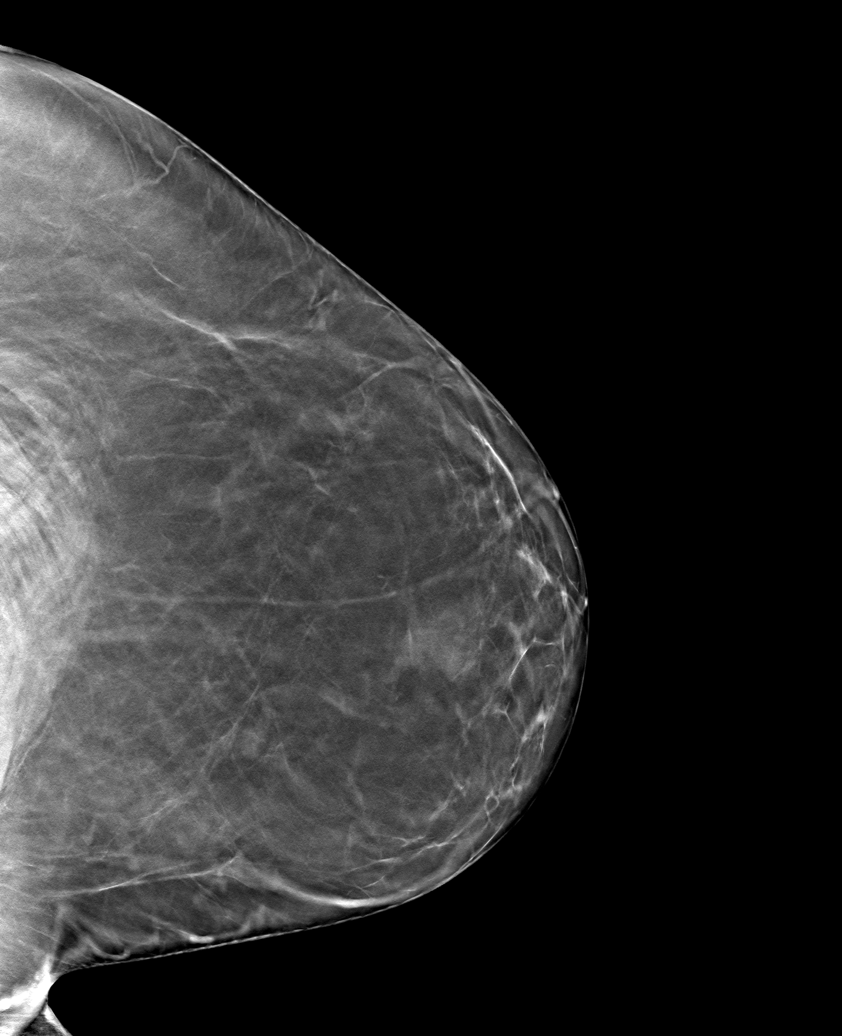

[6 of 30 positions shown; findings below may reference images not displayed]

ACR Breast Density Category b: There are scattered areas of
fibroglandular density.
FINDINGS: There are no findings suspicious for malignancy.
IMPRESSION: No mammographic evidence of malignancy. A result letter of this
screening mammogram will be mailed directly to the patient.

RECOMMENDATION:
Screening mammogram in one year. (Code:51-O-LD2)

BI-RADS CATEGORY  1: Negative.

## 2024-03-12 ENCOUNTER — Other Ambulatory Visit (HOSPITAL_BASED_OUTPATIENT_CLINIC_OR_DEPARTMENT_OTHER): Payer: Self-pay

## 2024-03-12 ENCOUNTER — Telehealth: Admitting: Physician Assistant

## 2024-03-12 DIAGNOSIS — R051 Acute cough: Secondary | ICD-10-CM

## 2024-03-12 MED ORDER — DOXYCYCLINE HYCLATE 100 MG PO CAPS
100.0000 mg | ORAL_CAPSULE | Freq: Two times a day (BID) | ORAL | 0 refills | Status: AC
  Filled 2024-03-12: qty 20, 10d supply, fill #0

## 2024-03-12 MED ORDER — PREDNISONE 20 MG PO TABS
40.0000 mg | ORAL_TABLET | Freq: Every day | ORAL | 0 refills | Status: AC
Start: 2024-03-12 — End: ?
  Filled 2024-03-12: qty 10, 5d supply, fill #0

## 2024-03-12 MED ORDER — BENZONATATE 200 MG PO CAPS
200.0000 mg | ORAL_CAPSULE | Freq: Two times a day (BID) | ORAL | 0 refills | Status: AC | PRN
  Filled 2024-03-12: qty 20, 10d supply, fill #0

## 2024-03-12 NOTE — Progress Notes (Signed)
 We are sorry that you are not feeling well.  Here is how we plan to help!  Based on your presentation I believe you most likely have A cough due to bacteria.  When patients have a fever and a productive cough with a change in color or increased sputum production, we are concerned about bacterial bronchitis.  If left untreated it can progress to pneumonia.  If your symptoms do not improve with your treatment plan it is important that you contact your provider.   I have prescribed Azithromyin 250 mg: two tablets now and then one tablet daily for 4 additonal days    In addition you may use A prescription cough medication called Tessalon  Perles 100mg . You may take 1-2 capsules every 8 hours as needed for your cough.    From your responses in the eVisit questionnaire you describe inflammation in the upper respiratory tract which is causing a significant cough.  This is commonly called Bronchitis and has four common causes:   Allergies Viral Infections Acid Reflux Bacterial Infection Allergies, viruses and acid reflux are treated by controlling symptoms or eliminating the cause. An example might be a cough caused by taking certain blood pressure medications. You stop the cough by changing the medication. Another example might be a cough caused by acid reflux. Controlling the reflux helps control the cough.     HOME CARE Only take medications as instructed by your medical team. Complete the entire course of an antibiotic. Drink plenty of fluids and get plenty of rest. Avoid close contacts especially the very young and the elderly Cover your mouth if you cough or cough into your sleeve. Always remember to wash your hands A steam or ultrasonic humidifier can help congestion.   GET HELP RIGHT AWAY IF: You develop worsening fever. You become short of breath You cough up blood. Your symptoms persist after you have completed your treatment plan MAKE SURE YOU  Understand these instructions. Will  watch your condition. Will get help right away if you are not doing well or get worse.  Your e-visit answers were reviewed by a board certified advanced clinical practitioner to complete your personal care plan.  Depending on the condition, your plan could have included both over the counter or prescription medications. If there is a problem please reply  once you have received a response from your provider. Your safety is important to us .  If you have drug allergies check your prescription carefully.    You can use MyChart to ask questions about today's visit, request a non-urgent call back, or ask for a work or school excuse for 24 hours related to this e-Visit. If it has been greater than 24 hours you will need to follow up with your provider, or enter a new e-Visit to address those concerns. You will get an e-mail in the next two days asking about your experience.  I hope that your e-visit has been valuable and will speed your recovery. Thank you for using e-visits.   I have spent 5 minutes in review of e-visit questionnaire, review and updating patient chart, medical decision making and response to patient.   Lamar Schlossman, PA-C

## 2024-03-12 NOTE — Addendum Note (Signed)
 Addended by: VIVIENNE DELON HERO on: 03/12/2024 09:49 AM   Modules accepted: Orders

## 2024-04-12 ENCOUNTER — Other Ambulatory Visit (HOSPITAL_COMMUNITY): Payer: Self-pay

## 2024-04-12 DIAGNOSIS — Z6841 Body Mass Index (BMI) 40.0 and over, adult: Secondary | ICD-10-CM | POA: Diagnosis not present

## 2024-04-12 DIAGNOSIS — N951 Menopausal and female climacteric states: Secondary | ICD-10-CM | POA: Diagnosis not present

## 2024-04-12 DIAGNOSIS — Z124 Encounter for screening for malignant neoplasm of cervix: Secondary | ICD-10-CM | POA: Diagnosis not present

## 2024-04-12 DIAGNOSIS — Z01419 Encounter for gynecological examination (general) (routine) without abnormal findings: Secondary | ICD-10-CM | POA: Diagnosis not present

## 2024-04-12 MED ORDER — PROGESTERONE 200 MG PO CAPS
200.0000 mg | ORAL_CAPSULE | Freq: Every day | ORAL | 3 refills | Status: AC
  Filled 2024-04-12 – 2024-05-07 (×3): qty 90, 90d supply, fill #0

## 2024-04-12 MED ORDER — ESTRADIOL 0.05 MG/24HR TD PTTW: 1.0000 | MEDICATED_PATCH | TRANSDERMAL | 3 refills | Status: AC

## 2024-04-13 ENCOUNTER — Other Ambulatory Visit (HOSPITAL_COMMUNITY): Payer: Self-pay

## 2024-04-19 DIAGNOSIS — M7711 Lateral epicondylitis, right elbow: Secondary | ICD-10-CM | POA: Diagnosis not present

## 2024-04-19 DIAGNOSIS — M25521 Pain in right elbow: Secondary | ICD-10-CM | POA: Diagnosis not present

## 2024-04-19 DIAGNOSIS — G5621 Lesion of ulnar nerve, right upper limb: Secondary | ICD-10-CM | POA: Diagnosis not present

## 2024-05-05 ENCOUNTER — Other Ambulatory Visit: Payer: Self-pay

## 2024-05-05 ENCOUNTER — Other Ambulatory Visit (HOSPITAL_COMMUNITY): Payer: Self-pay

## 2024-05-07 ENCOUNTER — Other Ambulatory Visit: Payer: Self-pay

## 2024-05-07 ENCOUNTER — Other Ambulatory Visit (HOSPITAL_COMMUNITY): Payer: Self-pay
